# Patient Record
Sex: Female | Born: 1975
Health system: Southern US, Community
[De-identification: ages and names within clinical notes are randomized; demographics above are authoritative.]

## PROBLEM LIST (undated history)

## (undated) DIAGNOSIS — I10 Essential (primary) hypertension: Secondary | ICD-10-CM

## (undated) DIAGNOSIS — R0789 Other chest pain: Secondary | ICD-10-CM

## (undated) DIAGNOSIS — H269 Unspecified cataract: Secondary | ICD-10-CM

## (undated) DIAGNOSIS — R7989 Other specified abnormal findings of blood chemistry: Secondary | ICD-10-CM

## (undated) DIAGNOSIS — Z87440 Personal history of urinary (tract) infections: Secondary | ICD-10-CM

## (undated) DIAGNOSIS — R42 Dizziness and giddiness: Secondary | ICD-10-CM

## (undated) DIAGNOSIS — R072 Precordial pain: Secondary | ICD-10-CM

## (undated) DIAGNOSIS — D509 Iron deficiency anemia, unspecified: Secondary | ICD-10-CM

## (undated) DIAGNOSIS — R002 Palpitations: Secondary | ICD-10-CM

## (undated) HISTORY — PX: EYE SURGERY: SHX253

## (undated) HISTORY — PX: COLONOSCOPY: SHX174

## (undated) HISTORY — DX: Palpitations: R00.2

## (undated) HISTORY — DX: Precordial pain: R07.2

## (undated) HISTORY — DX: Unspecified cataract: H26.9

## (undated) HISTORY — DX: Iron deficiency anemia, unspecified: D50.9

## (undated) HISTORY — DX: Other specified abnormal findings of blood chemistry: R79.89

## (undated) HISTORY — DX: Dizziness and giddiness: R42

## (undated) HISTORY — DX: Personal history of urinary (tract) infections: Z87.440

## (undated) HISTORY — PX: COSMETIC SURGERY: SHX468

## (undated) HISTORY — DX: Other chest pain: R07.89

## (undated) HISTORY — PX: CATARACT EXTRACTION: SUR2

## (undated) HISTORY — PX: UPPER GASTROINTESTINAL ENDOSCOPY: SHX188

---

## 1997-05-16 ENCOUNTER — Ambulatory Visit (HOSPITAL_COMMUNITY): Admission: RE | Admit: 1997-05-16 | Discharge: 1997-05-16 | Payer: Self-pay | Admitting: Obstetrics and Gynecology

## 1999-07-17 ENCOUNTER — Other Ambulatory Visit: Admission: RE | Admit: 1999-07-17 | Discharge: 1999-07-17 | Payer: Self-pay | Admitting: Gynecology

## 2000-10-28 ENCOUNTER — Other Ambulatory Visit: Admission: RE | Admit: 2000-10-28 | Discharge: 2000-10-28 | Payer: Self-pay | Admitting: Gynecology

## 2001-04-14 ENCOUNTER — Emergency Department (HOSPITAL_COMMUNITY): Admission: EM | Admit: 2001-04-14 | Discharge: 2001-04-14 | Payer: Self-pay | Admitting: Emergency Medicine

## 2001-04-14 ENCOUNTER — Encounter: Payer: Self-pay | Admitting: Emergency Medicine

## 2001-11-10 ENCOUNTER — Other Ambulatory Visit: Admission: RE | Admit: 2001-11-10 | Discharge: 2001-11-10 | Payer: Self-pay | Admitting: Gynecology

## 2003-03-11 ENCOUNTER — Other Ambulatory Visit: Admission: RE | Admit: 2003-03-11 | Discharge: 2003-03-11 | Payer: Self-pay | Admitting: Gynecology

## 2003-06-24 ENCOUNTER — Other Ambulatory Visit: Admission: RE | Admit: 2003-06-24 | Discharge: 2003-06-24 | Payer: Self-pay | Admitting: Gynecology

## 2003-12-07 ENCOUNTER — Inpatient Hospital Stay (HOSPITAL_COMMUNITY): Admission: AD | Admit: 2003-12-07 | Discharge: 2003-12-07 | Payer: Self-pay | Admitting: Gynecology

## 2003-12-12 ENCOUNTER — Inpatient Hospital Stay (HOSPITAL_COMMUNITY): Admission: AD | Admit: 2003-12-12 | Discharge: 2003-12-12 | Payer: Self-pay | Admitting: Gynecology

## 2003-12-13 ENCOUNTER — Inpatient Hospital Stay (HOSPITAL_COMMUNITY): Admission: AD | Admit: 2003-12-13 | Discharge: 2003-12-13 | Payer: Self-pay | Admitting: Gynecology

## 2003-12-15 ENCOUNTER — Inpatient Hospital Stay (HOSPITAL_COMMUNITY): Admission: AD | Admit: 2003-12-15 | Discharge: 2003-12-18 | Payer: Self-pay | Admitting: Gynecology

## 2004-01-23 ENCOUNTER — Other Ambulatory Visit: Admission: RE | Admit: 2004-01-23 | Discharge: 2004-01-23 | Payer: Self-pay | Admitting: Gynecology

## 2006-05-11 ENCOUNTER — Other Ambulatory Visit: Admission: RE | Admit: 2006-05-11 | Discharge: 2006-05-11 | Payer: Self-pay | Admitting: Obstetrics and Gynecology

## 2007-10-16 ENCOUNTER — Emergency Department (HOSPITAL_COMMUNITY): Admission: EM | Admit: 2007-10-16 | Discharge: 2007-10-16 | Payer: Self-pay | Admitting: Emergency Medicine

## 2008-05-28 ENCOUNTER — Emergency Department (HOSPITAL_COMMUNITY): Admission: EM | Admit: 2008-05-28 | Discharge: 2008-05-28 | Payer: Self-pay | Admitting: Emergency Medicine

## 2008-09-10 ENCOUNTER — Encounter: Payer: Self-pay | Admitting: Internal Medicine

## 2008-09-10 ENCOUNTER — Emergency Department (HOSPITAL_COMMUNITY): Admission: EM | Admit: 2008-09-10 | Discharge: 2008-09-10 | Payer: Self-pay | Admitting: Family Medicine

## 2008-09-10 LAB — CONVERTED CEMR LAB
BUN: 10 mg/dL
CO2: 27 meq/L
Chloride: 105 meq/L
Creatinine, Ser: 1 mg/dL
Glucose, Bld: 92 mg/dL
HCT: 41 %
Hemoglobin: 13.9 g/dL
Potassium: 3.9 meq/L
Sodium: 139 meq/L

## 2009-06-26 ENCOUNTER — Encounter: Payer: Self-pay | Admitting: Internal Medicine

## 2009-06-26 DIAGNOSIS — F329 Major depressive disorder, single episode, unspecified: Secondary | ICD-10-CM

## 2009-06-26 DIAGNOSIS — R51 Headache: Secondary | ICD-10-CM

## 2009-06-26 DIAGNOSIS — R519 Headache, unspecified: Secondary | ICD-10-CM | POA: Insufficient documentation

## 2010-05-24 LAB — POCT I-STAT, CHEM 8
BUN: 10 mg/dL (ref 6–23)
Calcium, Ion: 1.19 mmol/L (ref 1.12–1.32)
Chloride: 105 mEq/L (ref 96–112)
Creatinine, Ser: 1 mg/dL (ref 0.4–1.2)
Glucose, Bld: 92 mg/dL (ref 70–99)
HCT: 41 % (ref 36.0–46.0)
Hemoglobin: 13.9 g/dL (ref 12.0–15.0)
Potassium: 3.9 mEq/L (ref 3.5–5.1)
Sodium: 139 mEq/L (ref 135–145)
TCO2: 27 mmol/L (ref 0–100)

## 2010-05-24 LAB — POCT PREGNANCY, URINE: Preg Test, Ur: NEGATIVE

## 2010-07-03 NOTE — H&P (Signed)
[name], [name]                 ACCOUNT NO.:  [id number]   MEDICAL RECORD NO.:  [id number]          PATIENT TYPE:  MAT   LOCATION:  [facility]                          FACILITY:  [facility]   PHYSICIAN:  Timothy P. Fontaine, M.D.DATE OF BIRTH:  Jul 28, 1975   DATE OF ADMISSION:  12/12/2003  DATE OF DISCHARGE:                                HISTORY & PHYSICAL   DATE OF ADMISSION:  December 15, 2003   HISTORY OF PRESENT ILLNESS:  A 35 year old G65 P1 female at [redacted] weeks  gestation, increased musculoskeletal pain, low-back pain, discomfort;  ultrasound with 97 percentile infant measuring 3800 g; for induction with  favorable cervix.  The patient is noted to develop increasing  musculoskeletal discomfort that now is significantly limiting her ambulation  with increased pelvic pain.  Recent ultrasound showing large infant  measuring at 3800 g.  Blood pressure showing mild systolic elevations in the  140 range over 50s to 60s diastolic.  Options for continued expectant  management versus admission and induction were reviewed and the patient  elects for induction at 38 weeks.  For the remainder of her history, see her  [facility].   PHYSICAL EXAMINATION:  HEENT:  Normal.  LUNGS:  Clear.  CARDIAC:  Regular rate.  No rubs, murmurs, or gallops.  ABDOMEN:  Gravid, vertex fetus.  NST shows a reactive fetus with some mild,  irregular contractions.  PELVIC:  Loose fingertip, 80%, 0 station.   ASSESSMENT AND PLAN:  A 35 year old gravida 3 para 1 female at 30 weeks,  increased pelvic discomfort limiting ambulation, for induction with  favorable cervix.  Infant noted to be in the 97th percentile with 3800  grams.  The risks, benefits, indications, and alternatives for induction  were reviewed with the patient to include continued expectant management.  The patient is beta strep positive and will plan on antibiotic prophylaxis  during labor.     Timo   TPF/MEDQ  D:  12/12/2003  T:  12/12/2003  Job:   [id number]

## 2011-04-29 ENCOUNTER — Ambulatory Visit: Payer: Self-pay | Admitting: Family

## 2011-05-13 ENCOUNTER — Ambulatory Visit: Payer: Self-pay | Admitting: Family

## 2011-05-13 DIAGNOSIS — Z0289 Encounter for other administrative examinations: Secondary | ICD-10-CM

## 2011-12-27 ENCOUNTER — Encounter (HOSPITAL_COMMUNITY): Payer: Self-pay | Admitting: *Deleted

## 2011-12-27 ENCOUNTER — Emergency Department (HOSPITAL_COMMUNITY)
Admission: EM | Admit: 2011-12-27 | Discharge: 2011-12-28 | Disposition: A | Discharge status: Home / Self Care | Payer: 59 | Attending: Emergency Medicine | Admitting: Emergency Medicine

## 2011-12-27 DIAGNOSIS — N72 Inflammatory disease of cervix uteri: Secondary | ICD-10-CM | POA: Insufficient documentation

## 2011-12-27 DIAGNOSIS — R1032 Left lower quadrant pain: Secondary | ICD-10-CM | POA: Insufficient documentation

## 2011-12-27 DIAGNOSIS — N888 Other specified noninflammatory disorders of cervix uteri: Secondary | ICD-10-CM

## 2011-12-27 DIAGNOSIS — R109 Unspecified abdominal pain: Secondary | ICD-10-CM | POA: Insufficient documentation

## 2011-12-27 LAB — POCT PREGNANCY, URINE: Preg Test, Ur: NEGATIVE

## 2011-12-27 NOTE — ED Notes (Signed)
Pt states left flank pain for a few days. Pt states that the pain is constant and she took advil for the pain. Pt states that she has hx of urinary frequency but today has been having pressure yesterday.

## 2011-12-28 ENCOUNTER — Emergency Department (HOSPITAL_COMMUNITY): Payer: 59

## 2011-12-28 ENCOUNTER — Other Ambulatory Visit: Payer: Self-pay

## 2011-12-28 LAB — URINE MICROSCOPIC-ADD ON

## 2011-12-28 LAB — URINALYSIS, ROUTINE W REFLEX MICROSCOPIC
Bilirubin Urine: NEGATIVE
Glucose, UA: NEGATIVE mg/dL
Ketones, ur: NEGATIVE mg/dL
Leukocytes, UA: NEGATIVE
Nitrite: NEGATIVE
Protein, ur: NEGATIVE mg/dL
Specific Gravity, Urine: 1.03 (ref 1.005–1.030)
Urobilinogen, UA: 1 mg/dL (ref 0.0–1.0)
pH: 5.5 (ref 5.0–8.0)

## 2011-12-28 NOTE — ED Provider Notes (Signed)
History     CSN: [id number]  Arrival date & time 12/27/11  2335   First MD Initiated Contact with Patient 12/28/11 0009      Chief Complaint  Patient presents with  . Flank Pain    (Consider location/radiation/quality/duration/timing/severity/associated sxs/prior treatment) HPI HX per PT. L side and pelvic pain started a few days ago, relived by advil, has noticed some blood in her urine, but unable to say if she has any associated vaginal bleeding. LMP 2 weeks ago on time and normal. No F/C, no N/V/D, followed by OB GYN DR Maurice March. Pain mild to moderate, she took advil PTA and declines any meds now for minimal discomfort. No known aggrevating or alleviating factors. Last few weeks having some palpitations, notices it more when laying down, no SOB and no symptoms currently.  History reviewed. No pertinent past medical history.  History reviewed. No pertinent past surgical history.  History reviewed. No pertinent family history.  History  Substance Use Topics  . Smoking status: Never Smoker   . Smokeless tobacco: Not on file  . Alcohol Use: No    OB History    Grav Para Term Preterm Abortions TAB SAB Ect Mult Living                  Review of Systems  Constitutional: Negative for fever and chills.  HENT: Negative for neck pain and neck stiffness.   Eyes: Negative for pain.  Respiratory: Negative for shortness of breath.   Cardiovascular: Negative for leg swelling.  Gastrointestinal: Negative for nausea, vomiting and abdominal pain.  Genitourinary: Negative for dysuria, flank pain, vaginal discharge, genital sores and vaginal pain.  Musculoskeletal: Negative for back pain.  Skin: Negative for rash.  Neurological: Negative for headaches.  All other systems reviewed and are negative.    Allergies  Review of patient's allergies indicates no known allergies.  Home Medications   Current Outpatient Rx  Name  Route  Sig  Dispense  Refill  . IBUPROFEN 200 MG PO TABS  Oral   Take 400 mg by mouth every 6 (six) hours as needed. For pain           BP 137/92  Pulse 89  Temp 98.1 F (36.7 C) (Oral)  Resp 16  SpO2 99%  Physical Exam  Constitutional: She is oriented to person, place, and time. She appears well-developed and well-nourished.  HENT:  Head: Normocephalic and atraumatic.  Eyes: Conjunctivae normal and EOM are normal. Pupils are equal, round, and reactive to light.  Neck: Trachea normal. Neck supple. No thyromegaly present.  Cardiovascular: Normal rate, regular rhythm, S1 normal, S2 normal and normal pulses.     No systolic murmur is present   No diastolic murmur is present  Pulses:      Radial pulses are 2+ on the right side, and 2+ on the left side.  Pulmonary/Chest: Effort normal and breath sounds normal. She has no wheezes. She has no rhonchi. She has no rales.  Abdominal: Soft. Normal appearance and bowel sounds are normal. There is no CVA tenderness and negative Murphy's sign.       Mild LLQ tenderness. No acute ABD  Neurological: She is alert and oriented to person, place, and time. She has normal strength. No cranial nerve deficit or sensory deficit. GCS eye subscore is 4. GCS verbal subscore is 5. GCS motor subscore is 6.  Skin: Skin is warm and dry. No rash noted. She is not diaphoretic.  Psychiatric: Her speech is  normal.       Cooperative and appropriate    ED Course  Procedures (including critical care time)  Results for orders placed during the hospital encounter of 12/27/11  URINALYSIS, ROUTINE W REFLEX MICROSCOPIC      Component Value Range   Color, Urine YELLOW  YELLOW   APPearance CLOUDY (*) CLEAR   Specific Gravity, Urine 1.030  1.005 - 1.030   pH 5.5  5.0 - 8.0   Glucose, UA NEGATIVE  NEGATIVE mg/dL   Hgb urine dipstick LARGE (*) NEGATIVE   Bilirubin Urine NEGATIVE  NEGATIVE   Ketones, ur NEGATIVE  NEGATIVE mg/dL   Protein, ur NEGATIVE  NEGATIVE mg/dL   Urobilinogen, UA 1.0  0.0 - 1.0 mg/dL   Nitrite  NEGATIVE  NEGATIVE   Leukocytes, UA NEGATIVE  NEGATIVE  POCT PREGNANCY, URINE      Component Value Range   Preg Test, Ur NEGATIVE  NEGATIVE  URINE MICROSCOPIC-ADD ON      Component Value Range   Squamous Epithelial / LPF FEW (*) RARE   WBC, UA 0-2  <3 WBC/hpf   RBC / HPF 3-6  <3 RBC/hpf   Bacteria, UA MANY (*) RARE   Casts HYALINE CASTS (*) NEGATIVE   Urine-Other MUCOUS PRESENT     Ct Abdomen Pelvis Wo Contrast  12/28/2011  *RADIOLOGY REPORT*  Clinical Data: Left-sided abdominal pain.  Frequent urination.  CT ABDOMEN AND PELVIS WITHOUT CONTRAST  Technique:  Multidetector CT imaging of the abdomen and pelvis was performed following the standard protocol without intravenous contrast.  Comparison: None.  Findings: Lung bases are clear.  No effusions.  Heart is normal size.  Liver, stomach, gallbladder, spleen, pancreas, adrenals and kidneys are unremarkable.  No renal or ureteral stones.  No hydronephrosis.  Uterus and adnexa as well as urinary bladder grossly unremarkable. Bowel grossly unremarkable.  No free fluid, free air, or adenopathy.  Appendix is visualized and is normal.  No acute bony abnormality.  Small umbilical hernia containing fat as well as a single loop of small bowel.  No obstruction.  IMPRESSION: No renal or ureteral stones.  No hydronephrosis.  Umbilical hernia containing fat and a single small bowel loop.  No obstruction.   Original Report Authenticated By: Charlett Nose, M.D.    US Transvaginal Non-ob  12/28/2011  *RADIOLOGY REPORT*  Clinical Data: Pelvic pain  TRANSABDOMINAL AND TRANSVAGINAL ULTRASOUND OF PELVIS  Technique:  Both transabdominal and transvaginal ultrasound examinations of the pelvis were performed.  Transabdominal technique was performed for global imaging of the pelvis including uterus, ovaries, adnexal regions, and pelvic cul-de-sac.  It was necessary to proceed with endovaginal exam following the transabdominal exam to visualize the endometrium, and ovaries.   Comparison:  None.  Findings: Uterus:  The uterus measures 8.9 x 4.9 x 5.5 cm.  The myometrium is heterogeneous.  Partially submucosal structure within the anterior uterine fundus is identified measuring 1.8 x 1.5 x 0.9 cm.  Within the anterior and left side of the cervix there is a uniformly hypoechoic structure measuring 1 cm.  Several Nabothian cysts are noted.  Endometrium: Appears normal measuring 6 mm.  Right ovary: Normal appearance/no adnexal mass.  Measuring 3.1 x 1.5 x 2.0 cm.  Left ovary: Normal appearance/no adnexal mass.  Measures 2.6 x 1.9 x 2.2 cm.  Other Findings:  No free fluid.  Bilateral Doppler color flow to both ovaries noted.  IMPRESSION:  1. Uniformly hypoechoic structure within the ventral left side of the cervix is identified  and is favored to represent a complicated Nabothian cyst. 2. Partially the submucosal structure within the anterior and ventral uterine fundus is noted.  Favored to represent a small submucosal fibroid.   Original Report Authenticated By: Signa Kell, M.D.    US Pelvis Complete  12/28/2011  *RADIOLOGY REPORT*  Clinical Data: Pelvic pain  TRANSABDOMINAL AND TRANSVAGINAL ULTRASOUND OF PELVIS  Technique:  Both transabdominal and transvaginal ultrasound examinations of the pelvis were performed.  Transabdominal technique was performed for global imaging of the pelvis including uterus, ovaries, adnexal regions, and pelvic cul-de-sac.  It was necessary to proceed with endovaginal exam following the transabdominal exam to visualize the endometrium, and ovaries.  Comparison:  None.  Findings: Uterus:  The uterus measures 8.9 x 4.9 x 5.5 cm.  The myometrium is heterogeneous.  Partially submucosal structure within the anterior uterine fundus is identified measuring 1.8 x 1.5 x 0.9 cm.  Within the anterior and left side of the cervix there is a uniformly hypoechoic structure measuring 1 cm.  Several Nabothian cysts are noted.  Endometrium: Appears normal measuring 6 mm.   Right ovary: Normal appearance/no adnexal mass.  Measuring 3.1 x 1.5 x 2.0 cm.  Left ovary: Normal appearance/no adnexal mass.  Measures 2.6 x 1.9 x 2.2 cm.  Other Findings:  No free fluid.  Bilateral Doppler color flow to both ovaries noted.  IMPRESSION:  1. Uniformly hypoechoic structure within the ventral left side of the cervix is identified and is favored to represent a complicated Nabothian cyst. 2. Partially the submucosal structure within the anterior and ventral uterine fundus is noted.  Favored to represent a small submucosal fibroid.   Original Report Authenticated By: Signa Kell, M.D.       Date: 12/28/2011  Rate: 80  Rhythm: normal sinus rhythm  QRS Axis: normal  Intervals: normal  ST/T Wave abnormalities: nonspecific ST changes  Conduction Disutrbances:none  Narrative Interpretation:   Old EKG Reviewed: none available  PT reports some vaginal bleeding with [place], declines pain meds.  MDM   LLQ ABD pain evaluated with [place] - small fibroid and also nabothian cyst UA obtained/ reviewed  - U Cx sent and pending Screening ECG no arrythmia. Plan outpatient follow up for palpitations CT to evaluate for possible kidney stone, umbilical hernia fat containing only, repeat exam NTTP.  [place] reviewed and with some bleeding during [place], will plan f/u OB GYN. PT declines Rx pain meds, is stable for d/c home.       Sunnie Nielsen, MD 12/28/11 [phone]

## 2012-08-06 ENCOUNTER — Encounter (HOSPITAL_COMMUNITY): Payer: Self-pay | Admitting: Adult Health

## 2012-08-06 ENCOUNTER — Emergency Department (HOSPITAL_COMMUNITY)
Admission: EM | Admit: 2012-08-06 | Discharge: 2012-08-06 | Disposition: A | Discharge status: Home / Self Care | Payer: 59 | Attending: Emergency Medicine | Admitting: Emergency Medicine

## 2012-08-06 ENCOUNTER — Emergency Department (HOSPITAL_COMMUNITY): Payer: 59

## 2012-08-06 DIAGNOSIS — H53149 Visual discomfort, unspecified: Secondary | ICD-10-CM | POA: Insufficient documentation

## 2012-08-06 DIAGNOSIS — M25561 Pain in right knee: Secondary | ICD-10-CM

## 2012-08-06 DIAGNOSIS — M25569 Pain in unspecified knee: Secondary | ICD-10-CM | POA: Insufficient documentation

## 2012-08-06 DIAGNOSIS — G8911 Acute pain due to trauma: Secondary | ICD-10-CM | POA: Insufficient documentation

## 2012-08-06 NOTE — ED Provider Notes (Signed)
History  This chart was scribed for Maria Jakes, MD by Ardelia Mems, ED Scribe. This patient was seen in room TR10C/TR10C and the patient's care was started at 8:56 PM.   CSN: [id number]  Arrival date & time 08/06/12  1947     Chief Complaint  Patient presents with  . Knee Pain     The history is provided by the patient. No language interpreter was used.    HPI Comments: Maria [name] is a 37 y.o. female who presents to the Emergency Department complaining of constant, moderate, "throbbing" right knee pain with intermittent, severe "sharp" bursts of pain with sudden movements of the knee onset 2 weeks ago. Pt states that she wanted to wait at least a week before going to the doctor to allow the knee to heal. Pt states that she fell a couple of weeks ago, while playing kickball, and she twisted her right knee on the way down. Pt states that the knee becomes stiff at night. She deneis hx of knee pain or previous injuries. Pt denies head injury or LOC associated with the fall. Pt denies back pain, neck pain, vomiting, diarrhea or any other symptoms or pain.  PCP- Dr. Lynden Ang   History reviewed. No pertinent past medical history.  History reviewed. No pertinent past surgical history.  History reviewed. No pertinent family history.  History  Substance Use Topics  . Smoking status: Never Smoker   . Smokeless tobacco: Not on file  . Alcohol Use: No    OB History   Grav Para Term Preterm Abortions TAB SAB Ect Mult Living                  Review of Systems  Constitutional: Negative for fever and chills.  HENT: Negative for sore throat and neck pain.   Eyes: Positive for photophobia.  Respiratory: Negative for cough and shortness of breath.   Cardiovascular: Negative for chest pain.  Gastrointestinal: Negative for nausea, vomiting, diarrhea and abdominal distention.  Genitourinary: Negative for dysuria.  Musculoskeletal: Negative for back pain.  Skin:  Negative for rash.   A complete 10 system review of systems was obtained and all systems are negative except as noted in the HPI and PMH.   Allergies  Review of patient's allergies indicates no known allergies.  Home Medications   Current Outpatient Rx  Name  Route  Sig  Dispense  Refill  . ibuprofen (ADVIL,MOTRIN) 200 MG tablet   Oral   Take 400 mg by mouth every 6 (six) hours as needed. For pain           Triage Vitals: BP 139/77  Pulse 78  Temp(Src) 98.6 F (37 C) (Oral)  Resp 16  SpO2 100%  LMP 07/15/2012  Physical Exam  Nursing note and vitals reviewed. Constitutional: She is oriented to person, place, and time. She appears well-developed and well-nourished. No distress.  HENT:  Head: Normocephalic and atraumatic.  Eyes: Conjunctivae and EOM are normal. Pupils are equal, round, and reactive to light.  Neck: Normal range of motion. Neck supple.  No meningeal signs  Cardiovascular: Normal rate, regular rhythm and normal heart sounds.  Exam reveals no gallop and no friction rub.   No murmur heard. Pulmonary/Chest: Effort normal and breath sounds normal. No respiratory distress. She has no wheezes. She has no rales. She exhibits no tenderness.  Abdominal: Soft. Bowel sounds are normal. She exhibits no distension. There is no tenderness. There is no rebound and no guarding.  Musculoskeletal: Normal range of motion. She exhibits no edema and no tenderness.  5/5 strength throughout. Mild swelling. No erythema. No warmth. No effusion. Good quadricep strength on straight leg raise. No joint laxity.  Right knee: Tenderness to palpation of the medial aspect of the patella. Tenderness of the MCL with FROM of the knee. Able to bear weight.   Neurological: She is alert and oriented to person, place, and time. No cranial nerve deficit.  Skin: Skin is warm and dry. She is not diaphoretic. No erythema.  Psychiatric: She has a normal mood and affect.    ED Course  Procedures  (including critical care time)  DIAGNOSTIC STUDIES: Oxygen Saturation is 100% on RA, normal by my interpretation.    COORDINATION OF CARE: 9:10 PM- Pt advised of plan for treatment and pt agrees.     Labs Reviewed - No data to display Dg Knee Complete 4 Views Right  08/06/2012   *RADIOLOGY REPORT*  Clinical Data: Knee pain after a kickball injury.  RIGHT KNEE - COMPLETE 4+ VIEW  Comparison: None.  Findings: There is no fracture, dislocation, arthritis, or joint effusion.  IMPRESSION: Normal exam.   Original Report Authenticated By: Francene Boyers, M.D.     1. Medial knee pain, right       MDM  Imaging shows no fracture. Directed pt to ice injury, take acetaminophen or ibuprofen for pain, and to elevate and rest the injury when possible. Discussed follow up with ortho if pain persisted or worsened. Pt declined leg immobilizer and crutches. Discussed reasons to seek immediate care. Patient expresses understanding and agrees with plan. I personally performed the services described in this documentation, which was scribed in my presence. The recorded information has been reviewed and is accurate.    Glade Nurse, PA-C 08/06/12 2356

## 2012-08-06 NOTE — ED Notes (Signed)
Presents with 2-3 weeks of right knee pain that feels like a tugging sensation and began after a fall. No edema noted. CMS intact. Pain is worse when sitting or being still for long periods. Movement makes pain better.

## 2012-08-08 NOTE — ED Provider Notes (Signed)
Medical screening examination/treatment/procedure(s) were performed by non-physician practitioner and as supervising physician I was immediately available for consultation/collaboration.   Jedidiah Demartini W. Jisela Merlino, MD 08/08/12 0755 

## 2014-04-23 ENCOUNTER — Emergency Department (INDEPENDENT_AMBULATORY_CARE_PROVIDER_SITE_OTHER)
Admission: EM | Admit: 2014-04-23 | Discharge: 2014-04-23 | Disposition: A | Discharge status: Home / Self Care | Payer: 59 | Source: Home / Self Care | Attending: Family Medicine | Admitting: Family Medicine

## 2014-04-23 ENCOUNTER — Encounter (HOSPITAL_COMMUNITY): Payer: Self-pay | Admitting: Emergency Medicine

## 2014-04-23 DIAGNOSIS — J040 Acute laryngitis: Secondary | ICD-10-CM

## 2014-04-23 MED ORDER — PREDNISONE 10 MG PO TABS: 30.0000 mg | ORAL_TABLET | Freq: Every day | ORAL | Status: DC

## 2014-04-23 MED ORDER — IPRATROPIUM BROMIDE 0.06 % NA SOLN: 2.0000 | Freq: Four times a day (QID) | NASAL | Status: DC

## 2014-04-23 NOTE — ED Notes (Signed)
Sore throat and ear pain, 2 weeks

## 2014-04-23 NOTE — Discharge Instructions (Signed)
Thank you for coming in today. Call or go to the emergency room if you get worse, have trouble breathing, have chest pains, or palpitations.    Laryngitis At the top of your windpipe is your voice box. It is the source of your voice. Inside your voice box are 2 bands of muscles called vocal cords. When you breathe, your vocal cords are relaxed and open so that air can get into the lungs. When you decide to say something, these cords come together and vibrate. The sound from these vibrations goes into your throat and comes out through your mouth as sound. Laryngitis is an inflammation of the vocal cords that causes hoarseness, cough, loss of voice, sore throat, and dry throat. Laryngitis can be temporary (acute) or long-term (chronic). Most cases of acute laryngitis improve with time.Chronic laryngitis lasts for more than 3 weeks. CAUSES Laryngitis can often be related to excessive smoking, talking, or yelling, as well as inhalation of toxic fumes and allergies. Acute laryngitis is usually caused by a viral infection, vocal strain, measles or mumps, or bacterial infections. Chronic laryngitis is usually caused by vocal cord strain, vocal cord injury, postnasal drip, growths on the vocal cords, or acid reflux. SYMPTOMS   Cough.  Sore throat.  Dry throat. RISK FACTORS  Respiratory infections.  Exposure to irritating substances, such as cigarette smoke, excessive amounts of alcohol, stomach acids, and workplace chemicals.  Voice trauma, such as vocal cord injury from shouting or speaking too loud. DIAGNOSIS  Your cargiver will perform a physical exam. During the physical exam, your caregiver will examine your throat. The most common sign of laryngitis is hoarseness. Laryngoscopy may be necessary to confirm the diagnosis of this condition. This procedure allows your caregiver to look into the larynx. HOME CARE INSTRUCTIONS  Drink enough fluids to keep your urine clear or pale yellow.  Rest  until you no longer have symptoms or as directed by your caregiver.  Breathe in moist air.  Take all medicine as directed by your caregiver.  Do not smoke.  Talk as little as possible (this includes whispering).  Write on paper instead of talking until your voice is back to normal.  Follow up with your caregiver if your condition has not improved after 10 days. SEEK MEDICAL CARE IF:   You have trouble breathing.  You cough up blood.  You have persistent fever.  You have increasing pain.  You have difficulty swallowing. MAKE SURE YOU:  Understand these instructions.  Will watch your condition.  Will get help right away if you are not doing well or get worse. Document Released: 02/01/2005 Document Revised: 04/26/2011 Document Reviewed: 04/09/2010 [facility] Patient Information 2015 [place], [place]. This information is not intended to replace advice given to you by your health care provider. Make sure you discuss any questions you have with your health care provider.

## 2014-04-23 NOTE — ED Provider Notes (Signed)
Maria [name] is a 39 y.o. female who presents to Urgent Care today for sore throat. Patient notes a three-day history of sore throat. Symptoms have been mildly present for 2 weeks but worsened recently. She also developed hoarse voice today. No fevers chills nausea or vomiting. Patient had diarrhea yesterday. She's tried some over-the-counter medications which helps a bit. No trouble breathing or chest pains.   History reviewed. No pertinent past medical history. History reviewed. No pertinent past surgical history. History  Substance Use Topics  . Smoking status: Never Smoker   . Smokeless tobacco: Not on file  . Alcohol Use: No   ROS as above Medications: No current facility-administered medications for this encounter.   Current Outpatient Prescriptions  Medication Sig Dispense Refill  . ibuprofen (ADVIL,MOTRIN) 200 MG tablet Take 400 mg by mouth every 6 (six) hours as needed. For pain    . ipratropium (ATROVENT) 0.06 % nasal spray Place 2 sprays into both nostrils 4 (four) times daily. 15 mL 1  . predniSONE (DELTASONE) 10 MG tablet Take 3 tablets (30 mg total) by mouth daily. 15 tablet 0   No Known Allergies   Exam:  BP 158/88 mmHg  Pulse 79  Temp(Src) 98.4 F (36.9 C) (Oral)  Resp 18  SpO2 100% Gen: Well NAD HEENT: EOMI,  MMM posterior fracture cobblestoning. Normal tympanic membranes bilaterally Lungs: Normal work of breathing. CTABL hoarse voice Heart: RRR no MRG Abd: NABS, Soft. Nondistended, Nontender Exts: Brisk capillary refill, warm and well perfused.   No results found for this or any previous visit (from the past 24 hour(s)). No results found.  Assessment and Plan: 39 y.o. female with laryngitis treat with prednisone and Atrovent nasal spray.  Discussed warning signs or symptoms. Please see discharge instructions. Patient expresses understanding.     Gregor Hams, MD 04/23/14 2111

## 2014-05-05 ENCOUNTER — Emergency Department (INDEPENDENT_AMBULATORY_CARE_PROVIDER_SITE_OTHER)
Admission: EM | Admit: 2014-05-05 | Discharge: 2014-05-05 | Disposition: A | Discharge status: Home / Self Care | Payer: 59 | Source: Home / Self Care | Attending: Family Medicine | Admitting: Family Medicine

## 2014-05-05 ENCOUNTER — Encounter (HOSPITAL_COMMUNITY): Payer: Self-pay | Admitting: Emergency Medicine

## 2014-05-05 DIAGNOSIS — J040 Acute laryngitis: Secondary | ICD-10-CM | POA: Diagnosis not present

## 2014-05-05 NOTE — Discharge Instructions (Signed)
Laryngitis °At the top of your windpipe is your voice box. It is the source of your voice. Inside your voice box are 2 bands of muscles called vocal cords. When you breathe, your vocal cords are relaxed and open so that air can get into the lungs. When you decide to say something, these cords come together and vibrate. The sound from these vibrations goes into your throat and comes out through your mouth as sound.  °Laryngitis is an inflammation of the vocal cords that causes hoarseness, cough, loss of voice, sore throat, and dry throat. Laryngitis can be temporary (acute) or long-term (chronic). Most cases of acute laryngitis improve with time.Chronic laryngitis lasts for more than 3 weeks. °CAUSES °Laryngitis can often be related to excessive smoking, talking, or yelling, as well as inhalation of toxic fumes and allergies. Acute laryngitis is usually caused by a viral infection, vocal strain, measles or mumps, or bacterial infections. Chronic laryngitis is usually caused by vocal cord strain, vocal cord injury, postnasal drip, growths on the vocal cords, or acid reflux. °SYMPTOMS  °· Cough. °· Sore throat. °· Dry throat. °RISK FACTORS °· Respiratory infections. °· Exposure to irritating substances, such as cigarette smoke, excessive amounts of alcohol, stomach acids, and workplace chemicals. °· Voice trauma, such as vocal cord injury from shouting or speaking too loud. °DIAGNOSIS  °Your cargiver will perform a physical exam. During the physical exam, your caregiver will examine your throat. The most common sign of laryngitis is hoarseness. Laryngoscopy may be necessary to confirm the diagnosis of this condition. This procedure allows your caregiver to look into the larynx. °HOME CARE INSTRUCTIONS °· Drink enough fluids to keep your urine clear or pale yellow. °· Rest until you no longer have symptoms or as directed by your caregiver. °· Breathe in moist air. °· Take all medicine as directed by your  caregiver. °· Do not smoke. °· Talk as little as possible (this includes whispering). °· Write on paper instead of talking until your voice is back to normal. °· Follow up with your caregiver if your condition has not improved after 10 days. °SEEK MEDICAL CARE IF:  °· You have trouble breathing. °· You cough up blood. °· You have persistent fever. °· You have increasing pain. °· You have difficulty swallowing. °MAKE SURE YOU: °· Understand these instructions. °· Will watch your condition. °· Will get help right away if you are not doing well or get worse. °Document Released: 02/01/2005 Document Revised: 04/26/2011 Document Reviewed: 04/09/2010 °ExitCare® Patient Information ©2015 [employer]. This information is not intended to replace advice given to you by your health care provider. Make sure you discuss any questions you have with your health care provider. ° °

## 2014-05-05 NOTE — ED Provider Notes (Signed)
CSN: [id number]     Arrival date & time 05/05/14  5638 History   First MD Initiated Contact with Patient 05/05/14 [phone]     Chief Complaint  Patient presents with  . Sore Throat   (Consider location/radiation/quality/duration/timing/severity/associated sxs/prior Treatment) HPI       39 year old female presents for evaluation of laryngitis and ear discomfort. She started having the laryngitis about 3 weeks ago. She was seen here 2 weeks ago, treated with prednisone and Atrovent nasal spray. She took the prednisone which helped slightly but did not start the Atrovent nasal spray until the day before yesterday. She is still having the laryngitis. The severity waxes and wanes. She denies any pain in her throat or dysphagia. No chest pain or shortness of breath. She has never smoked.  History reviewed. No pertinent past medical history. Past Surgical History  Procedure Laterality Date  . Eye surgery     No family history on file. History  Substance Use Topics  . Smoking status: Never Smoker   . Smokeless tobacco: Not on file  . Alcohol Use: No   OB History    No data available     Review of Systems  Constitutional: Negative for fever and chills.  HENT: Positive for ear pain and voice change. Negative for postnasal drip, rhinorrhea and sore throat.   Respiratory: Positive for cough (mild, dry).   All other systems reviewed and are negative.   Allergies  Review of patient's allergies indicates no known allergies.  Home Medications   Prior to Admission medications   Medication Sig Start Date End Date Taking? Authorizing Provider  ipratropium (ATROVENT) 0.06 % nasal spray Place 2 sprays into both nostrils 4 (four) times daily. 04/23/14  Yes Gregor Hams, MD  ibuprofen (ADVIL,MOTRIN) 200 MG tablet Take 400 mg by mouth every 6 (six) hours as needed. For pain    Historical Provider, MD  predniSONE (DELTASONE) 10 MG tablet Take 3 tablets (30 mg total) by mouth daily. 04/23/14   Gregor Hams,  MD   BP 131/77 mmHg  Pulse 86  Temp(Src) 98 F (36.7 C) (Oral)  Resp 18  SpO2 99%  LMP 04/21/2014 Physical Exam  Constitutional: She is oriented to person, place, and time. Vital signs are normal. She appears well-developed and well-nourished. No distress.  HENT:  Head: Normocephalic and atraumatic.  Right Ear: External ear normal.  Left Ear: External ear normal.  Nose: Nose normal.  Mouth/Throat: Uvula is midline, oropharynx is clear and moist and mucous membranes are normal. No oropharyngeal exudate.  Eyes: Conjunctivae are normal.  Neck: Normal range of motion. Neck supple. No thyromegaly present.  Pulmonary/Chest: Effort normal and breath sounds normal. No respiratory distress. She has no wheezes. She has no rales.  Lymphadenopathy:    She has no cervical adenopathy.  Neurological: She is alert and oriented to person, place, and time. She has normal strength. Coordination normal.  Skin: Skin is warm and dry. No rash noted. She is not diaphoretic.  Psychiatric: She has a normal mood and affect. Judgment normal.  Nursing note and vitals reviewed.   ED Course  Procedures (including critical care time) Labs Review Labs Reviewed - No data to display  Imaging Review No results found.   MDM   1. Laryngitis    Laryngitis ongoing for 3 weeks. The fact that it waxes and wanes is reassuring. Advised her to treat with voice rest for one week, if this is still not lead to significant improvement she'll  follow-up with primary care or ENT for further evaluation.    Liam Graham, PA-C 05/05/14 [phone]

## 2014-05-05 NOTE — ED Notes (Signed)
Persistent sore throat.  [facility] at [facility] same 3/8.  Sore throat continues.  Had 2 days where throat was better.  No new symptoms

## 2015-12-17 ENCOUNTER — Ambulatory Visit (HOSPITAL_COMMUNITY)
Admission: EM | Admit: 2015-12-17 | Discharge: 2015-12-17 | Disposition: A | Discharge status: Home / Self Care | Payer: 59 | Attending: Emergency Medicine | Admitting: Emergency Medicine

## 2015-12-17 ENCOUNTER — Encounter (HOSPITAL_COMMUNITY): Payer: Self-pay | Admitting: Emergency Medicine

## 2015-12-17 DIAGNOSIS — G500 Trigeminal neuralgia: Secondary | ICD-10-CM

## 2015-12-17 HISTORY — DX: Essential (primary) hypertension: I10

## 2015-12-17 MED ORDER — GABAPENTIN 300 MG PO CAPS: 300.0000 mg | ORAL_CAPSULE | Freq: Every day | ORAL | 0 refills | Status: DC

## 2015-12-17 NOTE — Discharge Instructions (Signed)
A nerve in your head and face is irritated. This is what is causing the intermittent pains in your scalp. Take gabapentin daily at bedtime for 2 weeks. If things are not improving in 1-2 weeks, please follow-up with your primary care doctor.

## 2015-12-17 NOTE — ED Provider Notes (Signed)
[place]    CSN: [id number] Arrival date & time: 12/17/15  1004     History   Chief Complaint Chief Complaint  Patient presents with  . Sore Throat    HPI Maria [name] is a 40 y.o. female.   HPI She is a 40 year old woman here for evaluation of sore throat, ear pain, and scalp pain.  She states for the past 3-4 days, she has had a mild left-sided sore throat and left ear discomfort. She also developed a cold sore on her left lower lip. No fevers. She is more concerned about intermittent pains and sensitivity to her left frontal scalp. She states these have been getting more frequent and more intense over the last 3 days. She states her scalp feels sensitive. She denies any triggers for the pain. No other neurologic symptoms.  Past Medical History:  Diagnosis Date  . Hypertension     Patient Active Problem List   Diagnosis Date Noted  . DEPRESSION 06/26/2009  . HEADACHE 06/26/2009    Past Surgical History:  Procedure Laterality Date  . EYE SURGERY      OB History    No data available       Home Medications    Prior to Admission medications   Medication Sig Start Date End Date Taking? Authorizing Provider  gabapentin (NEURONTIN) 300 MG capsule Take 1 capsule (300 mg total) by mouth at bedtime. 12/17/15   Melony Overly, MD  ibuprofen (ADVIL,MOTRIN) 200 MG tablet Take 400 mg by mouth every 6 (six) hours as needed. For pain    Historical Provider, MD  ipratropium (ATROVENT) 0.06 % nasal spray Place 2 sprays into both nostrils 4 (four) times daily. 04/23/14   Gregor Hams, MD  predniSONE (DELTASONE) 10 MG tablet Take 3 tablets (30 mg total) by mouth daily. 04/23/14   Gregor Hams, MD    Family History History reviewed. No pertinent family history.  Social History Social History  Substance Use Topics  . Smoking status: Never Smoker  . Smokeless tobacco: Never Used  . Alcohol use No     Allergies   Review of patient's allergies indicates no  known allergies.   Review of Systems Review of Systems As in history of present illness  Physical Exam Triage Vital Signs ED Triage Vitals  Enc Vitals Group     BP 12/17/15 1020 161/98     Pulse Rate 12/17/15 1020 99     Resp 12/17/15 1020 16     Temp 12/17/15 1020 98.8 F (37.1 C)     Temp Source 12/17/15 1020 Oral     SpO2 12/17/15 1020 100 %     Weight --      Height --      Head Circumference --      Peak Flow --      Pain Score 12/17/15 1024 0     Pain Loc --      Pain Edu? --      Excl. in [place]? --    No data found.   Updated Vital Signs BP 161/98 (BP Location: Left Arm)   Pulse 99   Temp 98.8 F (37.1 C) (Oral)   Resp 16   LMP 12/03/2015 (Exact Date)   SpO2 100%   Visual Acuity Right Eye Distance:   Left Eye Distance:   Bilateral Distance:    Right Eye Near:   Left Eye Near:    Bilateral Near:     Physical  Exam  Constitutional: She is oriented to person, place, and time. She appears well-developed and well-nourished. No distress.  HENT:  Head:    Right Ear: Tympanic membrane normal.  Left Ear: Tympanic membrane is retracted (mildly).  Mouth/Throat: Oropharynx is clear and moist. No oropharyngeal exudate.    Neck: Neck supple.  Cardiovascular: Normal rate, regular rhythm and normal heart sounds.   No murmur heard. Pulmonary/Chest: Effort normal and breath sounds normal. No respiratory distress. She has no wheezes. She has no rales.  Lymphadenopathy:    She has no cervical adenopathy.  Neurological: She is alert and oriented to person, place, and time.     UC Treatments / Results  Labs (all labs ordered are listed, but only abnormal results are displayed) Labs Reviewed - No data to display  EKG  EKG Interpretation None       Radiology No results found.  Procedures Procedures (including critical care time)  Medications Ordered in UC Medications - No data to display   Initial Impression / Assessment and Plan / UC Course  I  have reviewed the triage vital signs and the nursing notes.  Pertinent labs & imaging results that were available during my care of the patient were reviewed by me and considered in my medical decision making (see chart for details).  Clinical Course    No sign of infection on history or exam. Given the distribution and description of pain, scalp sensitivity may be coming from trigeminal neuralgia. We'll do a trial of gabapentin for 2 weeks. If not improving or symptoms are worsening, she will follow-up with her PCP at [facility].  Final Clinical Impressions(s) / UC Diagnoses   Final diagnoses:  Trigeminal neuralgia    New Prescriptions New Prescriptions   GABAPENTIN (NEURONTIN) 300 MG CAPSULE    Take 1 capsule (300 mg total) by mouth at bedtime.     Melony Overly, MD 12/17/15 1052

## 2015-12-17 NOTE — ED Triage Notes (Signed)
The patient presented to the [facility] with a complaint of a sore throat, neck pain on the left side of her neck and head pain on the left side of her head. The patient described the head pain not as a headache but a sharp intermittent pain only on the left side of her head for 3 days.

## 2016-01-16 ENCOUNTER — Encounter: Payer: Self-pay | Admitting: Obstetrics & Gynecology

## 2016-01-16 ENCOUNTER — Other Ambulatory Visit: Payer: Self-pay | Admitting: Obstetrics & Gynecology

## 2016-01-16 ENCOUNTER — Ambulatory Visit (INDEPENDENT_AMBULATORY_CARE_PROVIDER_SITE_OTHER): Payer: 59 | Admitting: Obstetrics & Gynecology

## 2016-01-16 VITALS — BP 146/87 | HR 94 | Temp 97.9°F | Ht 69.0 in | Wt 192.0 lb

## 2016-01-16 DIAGNOSIS — Z01419 Encounter for gynecological examination (general) (routine) without abnormal findings: Secondary | ICD-10-CM

## 2016-01-16 DIAGNOSIS — Z1231 Encounter for screening mammogram for malignant neoplasm of breast: Secondary | ICD-10-CM

## 2016-01-16 DIAGNOSIS — Z0000 Encounter for general adult medical examination without abnormal findings: Secondary | ICD-10-CM | POA: Diagnosis not present

## 2016-01-16 NOTE — Progress Notes (Signed)
Subjective:    Maria [name] is a 40 y.o. MAA P2(22 and 74 yo kids) female who presents for an annual exam. The patient has no complaints today except that her periods are getting heavier as the years go by.  The patient is sexually active. GYN screening history: last pap: was normal. The patient wears seatbelts: yes. The patient participates in regular exercise: no. Has the patient ever been transfused or tattooed?: no. The patient reports that there is not domestic violence in her life.   Menstrual History: OB History    Gravida Para Term Preterm AB Living   2         2   SAB TAB Ectopic Multiple Live Births                  Menarche age: 19 Patient's last menstrual period was 12/21/2015.    The following portions of the patient's history were reviewed and updated as appropriate: allergies, current medications, past family history, past medical history, past social history, past surgical history and problem list.  Review of Systems Pertinent items are noted in HPI.   Married for 12 years, Has to interrupt sex to void, has OAB Has nocturia about 10 times per night, no accidents in day or night FH- No breast/gyn/colon cancer Mammogram done a year ago, due now Wants flu vaccine today FP- none   Objective:    BP (!) 146/87   Pulse 94   Temp 97.9 F (36.6 C) (Oral)   Ht 5\' 9"  (1.753 m)   Wt 192 lb (87.1 kg)   LMP 12/21/2015   BMI 28.35 kg/m   General Appearance:    Alert, cooperative, no distress, appears stated age  Head:    Normocephalic, without obvious abnormality, atraumatic  Eyes:    PERRL, conjunctiva/corneas clear, EOM's intact, fundi    benign, both eyes  Ears:    Normal TM's and external ear canals, both ears  Nose:   Nares normal, septum midline, mucosa normal, no drainage    or sinus tenderness  Throat:   Lips, mucosa, and tongue normal; teeth and gums normal  Neck:   Supple, symmetrical, trachea midline, no adenopathy;    thyroid:  no  enlargement/tenderness/nodules; no carotid   bruit or JVD  Back:     Symmetric, no curvature, ROM normal, no CVA tenderness  Lungs:     Clear to auscultation bilaterally, respirations unlabored  Chest Wall:    No tenderness or deformity   Heart:    Regular rate and rhythm, S1 and S2 normal, no murmur, rub   or gallop  Breast Exam:    No tenderness, masses, or nipple abnormality  Abdomen:     Soft, non-tender, bowel sounds active all four quadrants,    no masses, no organomegaly  Genitalia:    Normal female without lesion, discharge or tenderness     Extremities:   Extremities normal, atraumatic, no cyanosis or edema  Pulses:   2+ and symmetric all extremities  Skin:   Skin color, texture, turgor normal, no rashes or lesions  Lymph nodes:   Cervical, supraclavicular, and axillary nodes normal  Neurologic:   CNII-XII intact, normal strength, sensation and reflexes    throughout   .    Assessment:    Healthy female exam.    Plan:     Thin prep Pap smear. with cotesting Fasting labs at her convenience Mammogram Flu vaccine

## 2016-01-19 ENCOUNTER — Encounter: Payer: Self-pay | Admitting: *Deleted

## 2016-01-19 LAB — CYTOLOGY - PAP
Diagnosis: NEGATIVE
HPV (WINDOPATH): NOT DETECTED

## 2016-01-23 ENCOUNTER — Other Ambulatory Visit: Payer: 59

## 2016-02-13 ENCOUNTER — Ambulatory Visit
Admission: RE | Admit: 2016-02-13 | Discharge: 2016-02-13 | Disposition: A | Discharge status: Home / Self Care | Payer: 59 | Source: Ambulatory Visit | Attending: Obstetrics & Gynecology | Admitting: Obstetrics & Gynecology

## 2016-02-13 DIAGNOSIS — Z1231 Encounter for screening mammogram for malignant neoplasm of breast: Secondary | ICD-10-CM

## 2016-02-17 ENCOUNTER — Encounter: Payer: Self-pay | Admitting: Obstetrics and Gynecology

## 2016-07-22 ENCOUNTER — Encounter (HOSPITAL_COMMUNITY): Payer: Self-pay

## 2016-07-22 DIAGNOSIS — M79601 Pain in right arm: Secondary | ICD-10-CM | POA: Insufficient documentation

## 2016-07-22 DIAGNOSIS — R5381 Other malaise: Secondary | ICD-10-CM | POA: Diagnosis present

## 2016-07-22 DIAGNOSIS — Z5321 Procedure and treatment not carried out due to patient leaving prior to being seen by health care provider: Secondary | ICD-10-CM | POA: Insufficient documentation

## 2016-07-22 LAB — CBC
HEMATOCRIT: 29.1 % — AB (ref 36.0–46.0)
Hemoglobin: 8.9 g/dL — ABNORMAL LOW (ref 12.0–15.0)
MCH: 25.4 pg — ABNORMAL LOW (ref 26.0–34.0)
MCHC: 30.6 g/dL (ref 30.0–36.0)
MCV: 82.9 fL (ref 78.0–100.0)
PLATELETS: 249 10*3/uL (ref 150–400)
RBC: 3.51 MIL/uL — ABNORMAL LOW (ref 3.87–5.11)
RDW: 17.1 % — AB (ref 11.5–15.5)
WBC: 6.4 10*3/uL (ref 4.0–10.5)

## 2016-07-22 LAB — BASIC METABOLIC PANEL
Anion gap: 4 — ABNORMAL LOW (ref 5–15)
BUN: 9 mg/dL (ref 6–20)
CHLORIDE: 105 mmol/L (ref 101–111)
CO2: 27 mmol/L (ref 22–32)
CREATININE: 0.98 mg/dL (ref 0.44–1.00)
Calcium: 9 mg/dL (ref 8.9–10.3)
GFR calc Af Amer: >60 mL/min (ref >60)
GFR calc non Af Amer: >60 mL/min (ref >60)
GLUCOSE: 99 mg/dL (ref 65–99)
POTASSIUM: 3.8 mmol/L (ref 3.5–5.1)
Sodium: 136 mmol/L (ref 135–145)

## 2016-07-22 LAB — I-STAT TROPONIN, ED: Troponin i, poc: 0 ng/mL (ref 0.00–0.08)

## 2016-07-22 NOTE — ED Triage Notes (Signed)
Pt endorses general malaise that began this morning and states that her right arm began hurting later in the day and she had to leave work, Denies injury. Also denies shob, nausea or other cardiac sx. VSS

## 2016-07-23 ENCOUNTER — Emergency Department (HOSPITAL_COMMUNITY)
Admission: EM | Admit: 2016-07-23 | Discharge: 2016-07-23 | Discharge status: Against Medical Advice | Payer: 59 | Attending: Emergency Medicine | Admitting: Emergency Medicine

## 2016-07-23 LAB — I-STAT BETA HCG BLOOD, ED (MC, WL, AP ONLY)

## 2016-07-23 NOTE — ED Notes (Signed)
Pt called multiple times to go back to a room and no response. Left post triage

## 2016-07-23 NOTE — ED Notes (Signed)
Called in waiting room to go back to a room and no response.

## 2016-11-22 ENCOUNTER — Telehealth: Payer: Self-pay

## 2016-11-22 NOTE — Telephone Encounter (Signed)
-----   Message from Blanchie Dessert, [place] sent at 11/22/2016  8:17 AM EDT ----- Regarding: needs refill for [facility] Contact: [phone] Pt states that she needs a refill for [facility], says that she never started taking it. I dont see that a rx was sent to a pharmacy for her but I maybe mistaking. She saw [facility] back in Dec of last year.

## 2016-11-22 NOTE — Telephone Encounter (Signed)
Call patient advised patient that she would need to come in for appointment to discuss getting on birth control.

## 2016-12-06 ENCOUNTER — Encounter: Payer: Self-pay | Admitting: Advanced Practice Midwife

## 2016-12-13 ENCOUNTER — Encounter: Payer: Self-pay | Admitting: Advanced Practice Midwife

## 2016-12-13 ENCOUNTER — Ambulatory Visit (INDEPENDENT_AMBULATORY_CARE_PROVIDER_SITE_OTHER): Payer: 59 | Admitting: Advanced Practice Midwife

## 2016-12-13 VITALS — BP 132/96 | HR 106 | Ht 68.0 in | Wt 194.0 lb

## 2016-12-13 DIAGNOSIS — Z30013 Encounter for initial prescription of injectable contraceptive: Secondary | ICD-10-CM | POA: Diagnosis not present

## 2016-12-13 DIAGNOSIS — Z113 Encounter for screening for infections with a predominantly sexual mode of transmission: Secondary | ICD-10-CM

## 2016-12-13 DIAGNOSIS — Z124 Encounter for screening for malignant neoplasm of cervix: Secondary | ICD-10-CM | POA: Diagnosis not present

## 2016-12-13 DIAGNOSIS — Z01419 Encounter for gynecological examination (general) (routine) without abnormal findings: Secondary | ICD-10-CM | POA: Diagnosis not present

## 2016-12-13 DIAGNOSIS — Z30011 Encounter for initial prescription of contraceptive pills: Secondary | ICD-10-CM

## 2016-12-13 DIAGNOSIS — Z0000 Encounter for general adult medical examination without abnormal findings: Secondary | ICD-10-CM

## 2016-12-13 MED ORDER — MEDROXYPROGESTERONE ACETATE 150 MG/ML IM SUSP: 150.0000 mg | INTRAMUSCULAR | 3 refills | Status: DC

## 2016-12-13 NOTE — Progress Notes (Signed)
Patient ID: Maria [name], female   DOB: 03/13/1975, 41 y.o.   MRN: [id number]    Gynecology Annual Exam  PCP: Patient, No Pcp Per  Chief Complaint:  Chief Complaint  Patient presents with  . Gynecologic Exam    discuss birthcontrol (Pill)  . Menorrhagia    History of Present Illness: Patient is a 41 y.o. [id number] presents for annual exam. The patient has no complaints today. She is requesting Depo Provera for birth control and period control.  LMP: Patient's last menstrual period was 12/04/2016 (exact date). Average Interval: regular, 28 days Duration of flow: 6 days Heavy Menses: varies Clots: no Intermenstrual Bleeding: no Postcoital Bleeding: no Dysmenorrhea: no   The patient is sexually active. She currently uses none for contraception. She denies dyspareunia.  The patient does not perform self breast exams.  There is no notable family history of breast or ovarian cancer in her family.  The patient wears seatbelts: yes.   The patient has regular exercise: yes.    The patient denies current symptoms of depression.    Review of Systems: Review of Systems  Constitutional: Negative.   HENT: Negative.   Eyes: Negative.   Respiratory: Negative.   Cardiovascular: Negative.   Gastrointestinal: Negative.   Genitourinary: Negative.   Musculoskeletal: Negative.   Skin: Negative.   Neurological: Negative.   Endo/Heme/Allergies: Negative.   Psychiatric/Behavioral: Negative.     Past Medical History:  Past Medical History:  Diagnosis Date  . Hypertension     Past Surgical History:  Past Surgical History:  Procedure Laterality Date  . EYE SURGERY      Gynecologic History:  Patient's last menstrual period was 12/04/2016 (exact date). Contraception: none Last Pap: 10 months ago Results were:  no abnormalities  Last mammogram: 2 years ago Results were: BI-RAD I Obstetric History: [id number]  Family History:  Family History  Problem Relation Age of Onset    . Diabetes Maternal Grandmother   . Throat cancer Maternal Grandfather     Social History:  Social History   Social History  . Marital status: Married    Spouse name: N/A  . Number of children: N/A  . Years of education: N/A   Occupational History  . Not on file.   Social History Main Topics  . Smoking status: Never Smoker  . Smokeless tobacco: Never Used  . Alcohol use No  . Drug use: No  . Sexual activity: Yes    Birth control/ protection: None   Other Topics Concern  . Not on file   Social History Narrative  . No narrative on file    Allergies:  No Known Allergies  Medications: Prior to Admission medications   Not on File    Physical Exam Vitals: Blood pressure (!) 132/96, pulse (!) 106, height 5\' 8"  (1.727 m), weight 194 lb (88 kg), last menstrual period 12/04/2016.  General: NAD HEENT: normocephalic, anicteric Thyroid: no enlargement, no palpable nodules Pulmonary: No increased work of breathing, CTAB Cardiovascular: RRR, distal pulses 2+ Breast: Breast symmetrical, no tenderness, no palpable nodules or masses, no skin or nipple retraction present, no nipple discharge.  No axillary or supraclavicular lymphadenopathy. Abdomen: NABS, soft, non-tender, non-distended.  Umbilicus without lesions.  No hepatomegaly, splenomegaly or masses palpable. No evidence of hernia  Genitourinary:  External: Normal external female genitalia.  Normal urethral meatus, normal  Bartholin's and Skene's glands.    Vagina: Normal vaginal mucosa, no evidence of prolapse.    Cervix: Grossly normal in appearance,  no bleeding, no CMT  Uterus: Non-enlarged, mobile, normal contour.    Adnexa: ovaries non-enlarged, no adnexal masses  Rectal: deferred  Lymphatic: no evidence of inguinal lymphadenopathy Extremities: no edema, erythema, or tenderness Neurologic: Grossly intact Psychiatric: mood appropriate, affect full   Assessment: 41 y.o. G2P2002 routine annual  exam  Plan: Problem List Items Addressed This Visit    None    Visit Diagnoses    Well woman exam with routine gynecological exam    -  Primary   Relevant Orders   IGP,CtNgTv,Apt HPV,rfx16/18,45   MM DIGITAL SCREENING BILATERAL   Screen for sexually transmitted diseases       Relevant Orders   IGP,CtNgTv,Apt HPV,rfx16/18,45   Cervical cancer screening       Relevant Orders   IGP,CtNgTv,Apt HPV,rfx16/18,45   Blood tests for routine general physical examination       Relevant Orders   VITAMIN D 25 Hydroxy (Vit-D Deficiency, Fractures)   Hgb A1c w/o eAG   Lipid Panel With LDL/HDL Ratio   CBC   Encounter for initial prescription of injectable contraceptive       Relevant Medications   medroxyPROGESTERone (DEPO-PROVERA) 150 MG/ML injection      1) Mammogram - recommend yearly screening mammogram.  Mammogram Was ordered today   2) STI screening was offered and accepted  3) ASCCP guidelines and rational discussed.  Patient opts for yearly screening interval  4) Contraception - Depo Provera  5) Colonoscopy -- Screening recommended starting at age 43 for average risk individuals, age 71 for individuals deemed at increased risk (including African Americans) and recommended to continue until age 2.  For patient age 26-85 individualized approach is recommended.  Gold standard screening is via colonoscopy, Cologuard screening is an acceptable alternative for patient unwilling or unable to undergo colonoscopy.  "Colorectal cancer screening for average?risk adults: 2018 guideline update from the [place]: A Cancer Journal for Clinicians: Jul 14, 2016  6) Routine healthcare maintenance including cholesterol, diabetes screening discussed Ordered today   7) Return to clinic in 1 year for annual exam  Rod Can, CNM

## 2016-12-14 LAB — VITAMIN D 25 HYDROXY (VIT D DEFICIENCY, FRACTURES): VIT D 25 HYDROXY: 13.1 ng/mL — AB (ref 30.0–100.0)

## 2016-12-14 LAB — LIPID PANEL WITH LDL/HDL RATIO
CHOLESTEROL TOTAL: 180 mg/dL (ref 100–199)
HDL: 56 mg/dL (ref >39)
LDL Calculated: 108 mg/dL — ABNORMAL HIGH (ref 0–99)
LDL/HDL RATIO: 1.9 ratio (ref 0.0–3.2)
TRIGLYCERIDES: 78 mg/dL (ref 0–149)
VLDL Cholesterol Cal: 16 mg/dL (ref 5–40)

## 2016-12-14 LAB — CBC
HEMATOCRIT: 31.9 % — AB (ref 34.0–46.6)
HEMOGLOBIN: 9.3 g/dL — AB (ref 11.1–15.9)
MCH: 24.4 pg — AB (ref 26.6–33.0)
MCHC: 29.2 g/dL — AB (ref 31.5–35.7)
MCV: 84 fL (ref 79–97)
Platelets: 284 10*3/uL (ref 150–379)
RBC: 3.81 x10E6/uL (ref 3.77–5.28)
RDW: 18 % — ABNORMAL HIGH (ref 12.3–15.4)
WBC: 5.4 10*3/uL (ref 3.4–10.8)

## 2016-12-14 LAB — HGB A1C W/O EAG: Hgb A1c MFr Bld: 5.3 % (ref 4.8–5.6)

## 2016-12-16 LAB — IGP,CTNGTV,APT HPV,RFX16/18,45
CHLAMYDIA, NUC. ACID AMP: NEGATIVE
Gonococcus, Nuc. Acid Amp: NEGATIVE
HPV APTIMA: NEGATIVE
PAP SMEAR COMMENT: 0
Trich vag by NAA: NEGATIVE

## 2017-01-10 ENCOUNTER — Telehealth: Payer: Self-pay

## 2017-01-10 MED ORDER — NORGESTIMATE-ETH ESTRADIOL 0.25-35 MG-MCG PO TABS: 1.0000 | ORAL_TABLET | Freq: Every day | ORAL | 11 refills | Status: DC

## 2017-01-10 NOTE — Progress Notes (Signed)
Pt is now requesting Rx for OCP instead of Depo. New Rx sent.

## 2017-01-10 NOTE — Telephone Encounter (Signed)
Pt states she was given a rx for Depo, but has decided against it & would like to request rx for OCP. ZJ#[phone]

## 2017-01-10 NOTE — Telephone Encounter (Signed)
Pt aware.

## 2017-01-10 NOTE — Addendum Note (Signed)
Addended by: Rod Can on: 01/10/2017 02:22 PM   Modules accepted: Orders

## 2017-01-10 NOTE — Telephone Encounter (Signed)
Sent in Rx for OCP instead of Depo. Can you please let her know. Thank you.

## 2017-02-15 HISTORY — PX: ABDOMINOPLASTY: SUR9

## 2017-07-15 ENCOUNTER — Ambulatory Visit: Payer: 59 | Admitting: Obstetrics & Gynecology

## 2017-07-20 ENCOUNTER — Ambulatory Visit: Payer: 59 | Admitting: Advanced Practice Midwife

## 2017-10-26 ENCOUNTER — Emergency Department
Admission: EM | Admit: 2017-10-26 | Discharge: 2017-10-26 | Disposition: A | Discharge status: Home / Self Care | Payer: Self-pay | Attending: Emergency Medicine | Admitting: Emergency Medicine

## 2017-10-26 ENCOUNTER — Other Ambulatory Visit: Payer: Self-pay

## 2017-10-26 ENCOUNTER — Encounter: Payer: Self-pay | Admitting: *Deleted

## 2017-10-26 DIAGNOSIS — J040 Acute laryngitis: Secondary | ICD-10-CM | POA: Diagnosis not present

## 2017-10-26 DIAGNOSIS — R490 Dysphonia: Secondary | ICD-10-CM | POA: Insufficient documentation

## 2017-10-26 DIAGNOSIS — H9202 Otalgia, left ear: Secondary | ICD-10-CM | POA: Insufficient documentation

## 2017-10-26 DIAGNOSIS — Z5321 Procedure and treatment not carried out due to patient leaving prior to being seen by health care provider: Secondary | ICD-10-CM | POA: Insufficient documentation

## 2017-10-26 DIAGNOSIS — R05 Cough: Secondary | ICD-10-CM | POA: Insufficient documentation

## 2017-10-26 NOTE — ED Triage Notes (Signed)
Pt reports left earache and cough.   Pt also reports hoarseness started today.  Pt alert

## 2017-11-10 ENCOUNTER — Encounter: Payer: Self-pay | Admitting: *Deleted

## 2017-11-10 ENCOUNTER — Emergency Department
Admission: EM | Admit: 2017-11-10 | Discharge: 2017-11-10 | Disposition: A | Discharge status: Home / Self Care | Payer: 59 | Attending: Emergency Medicine | Admitting: Emergency Medicine

## 2017-11-10 ENCOUNTER — Other Ambulatory Visit: Payer: Self-pay

## 2017-11-10 DIAGNOSIS — H9202 Otalgia, left ear: Secondary | ICD-10-CM | POA: Insufficient documentation

## 2017-11-10 DIAGNOSIS — I10 Essential (primary) hypertension: Secondary | ICD-10-CM | POA: Insufficient documentation

## 2017-11-10 DIAGNOSIS — J029 Acute pharyngitis, unspecified: Secondary | ICD-10-CM | POA: Diagnosis present

## 2017-11-10 DIAGNOSIS — J040 Acute laryngitis: Secondary | ICD-10-CM | POA: Diagnosis not present

## 2017-11-10 DIAGNOSIS — J028 Acute pharyngitis due to other specified organisms: Secondary | ICD-10-CM | POA: Diagnosis not present

## 2017-11-10 DIAGNOSIS — Z79899 Other long term (current) drug therapy: Secondary | ICD-10-CM | POA: Diagnosis not present

## 2017-11-10 DIAGNOSIS — B9789 Other viral agents as the cause of diseases classified elsewhere: Secondary | ICD-10-CM | POA: Diagnosis not present

## 2017-11-10 MED ORDER — FEXOFENADINE-PSEUDOEPHED ER 60-120 MG PO TB12: 1.0000 | ORAL_TABLET | Freq: Two times a day (BID) | ORAL | 0 refills | Status: AC

## 2017-11-10 MED ORDER — BENZONATATE 100 MG PO CAPS: ORAL_CAPSULE | ORAL | 0 refills | Status: DC

## 2017-11-10 MED ORDER — FLUTICASONE PROPIONATE 50 MCG/ACT NA SUSP: 2.0000 | Freq: Every day | NASAL | 0 refills | Status: DC

## 2017-11-10 MED ORDER — BENZONATATE 100 MG PO CAPS
200.0000 mg | ORAL_CAPSULE | Freq: Once | ORAL | Status: AC
  Administered 2017-11-10: 200 mg via ORAL
  Filled 2017-11-10: qty 2

## 2017-11-10 NOTE — ED Triage Notes (Signed)
Pt has a sore throat and left earache for 3 weeks. Pt alert

## 2017-11-10 NOTE — Discharge Instructions (Signed)
Your exam is consistent with a likely viral URI causing laryngitis and earache. Take the prescription meds as directed. Start an OTC Benadryl at bedtime. You may also take OTC Delsym for cough relief. Drink warm tea to reduce sore throat. Consider voice rest, as appropriate.

## 2017-11-11 NOTE — ED Provider Notes (Signed)
[facility] Emergency Department Provider Note ____________________________________________  Time seen: 2240  I have reviewed the triage vital signs and the nursing notes.  HISTORY  Chief Complaint  Sore Throat  HPI Maria [name] is a 42 y.o. female who presents herself to the ED for evaluation of intermittent left earache and mild sore throat for the last 3 weeks.  Patient also reports hoarseness that is been persistent for at least the last week.  She describes being treated for an upper respiratory infection about 2 weeks prior with a steroid taper pack that she completed about a week earlier.  She reports that her previous cough and sinus congestion responded well to the steroids but her hoarseness and intermittent sharp ear pain has persisted.  She denies any fevers, chills, sweats patient also denies any significant sinus congestion.  She does note some postnasal drainage as well as some persistent clearing of the throat.  She has not taken any other medications in the interim for symptom relief.  She presents now for further evaluation and management.  Nuys any sick contacts, recent travel, or other exposures.  Past Medical History:  Diagnosis Date  . Hypertension     Patient Active Problem List   Diagnosis Date Noted  . DEPRESSION 06/26/2009  . HEADACHE 06/26/2009    Past Surgical History:  Procedure Laterality Date  . EYE SURGERY      Prior to Admission medications   Medication Sig Start Date End Date Taking? Authorizing Provider  benzonatate (TESSALON PERLES) 100 MG capsule Take 1-2 tabs TID prn cough 11/10/17   Blakelynn Scheeler, Dannielle Karvonen, PA-C  fexofenadine-pseudoephedrine (ALLEGRA-D ALLERGY & CONGESTION) 60-120 MG 12 hr tablet Take 1 tablet by mouth 2 (two) times daily for 15 days. 11/10/17 11/25/17  Ronda Kazmi, Dannielle Karvonen, PA-C  fluticasone (FLONASE) 50 MCG/ACT nasal spray Place 2 sprays into both nostrils daily. 11/10/17   Ava Tangney, Dannielle Karvonen, PA-C  norgestimate-ethinyl estradiol (ORTHO-CYCLEN,SPRINTEC,PREVIFEM) 0.25-35 MG-MCG tablet Take 1 tablet by mouth daily. 01/10/17   Rod Can, CNM    Allergies Patient has no known allergies.  Family History  Problem Relation Age of Onset  . Diabetes Maternal Grandmother   . Throat cancer Maternal Grandfather     Social History Social History   Tobacco Use  . Smoking status: Never Smoker  . Smokeless tobacco: Never Used  Substance Use Topics  . Alcohol use: No  . Drug use: No    Review of Systems  Constitutional: Negative for fever. Eyes: Negative for visual changes. ENT: Positive for sore throat and hoarseness.  Reports left otalgia as above. Cardiovascular: Negative for chest pain. Respiratory: Negative for shortness of breath. Gastrointestinal: Negative for abdominal pain, vomiting and diarrhea. Skin: Negative for rash. Neurological: Negative for headaches, focal weakness or numbness. ____________________________________________  PHYSICAL EXAM:  VITAL SIGNS: ED Triage Vitals  Enc Vitals Group     BP 11/10/17 2140 (!) 153/84     Pulse Rate 11/10/17 2140 (!) 103     Resp 11/10/17 2140 18     Temp 11/10/17 2140 99.5 F (37.5 C)     Temp Source 11/10/17 2140 Oral     SpO2 11/10/17 2140 99 %     Weight 11/10/17 2141 190 lb (86.2 kg)     Height 11/10/17 2141 5\' 8"  (1.727 m)     Head Circumference --      Peak Flow --      Pain Score 11/10/17 2141 8  Pain Loc --      Pain Edu? --      Excl. in [place]? --     Constitutional: Alert and oriented. Well appearing and in no distress. Head: Normocephalic and atraumatic. Eyes: Conjunctivae are normal. PERRL. Normal extraocular movements Ears: Canals clear. TMs intact bilaterally.  No serous or purulent effusion noted. Nose: No congestion/rhinorrhea/epistaxis.  Turbinates are pink, moist, and enlarged. Mouth/Throat: Mucous membranes are moist.  There is midline and tonsils are flat.  There is some  oropharyngeal edema appreciated, consistent with likely postnasal drainage. Neck: Supple. No thyromegaly. Hematological/Lymphatic/Immunological: No cervical lymphadenopathy. Cardiovascular: Normal rate, regular rhythm. Normal distal pulses. Respiratory: Normal respiratory effort. No wheezes/rales/rhonchi. ____________________________________________  PROCEDURES  Procedures Tessalon Perles 200 mg PO ____________________________________________  INITIAL IMPRESSION / ASSESSMENT AND PLAN / ED COURSE  Patient with ED evaluation of laryngitis and intermittent ear pain on the left.  Patient's exam is overall benign without any signs of any acute tonsillitis or AOM.  Her symptoms likely represent a viral etiology of continued postnasal drainage.  Her otalgia may be due to a subclinical serous effusion.  Patient is advised to take the prescription Flonase, Tessalon Perles, and Allegra-D as prescribed.  She is also advised she may dose over-the-counter Delsym for additional cough relief.  Voice rest is suggested and the patient is also advised to consider warm liquids or throat lozenges to help alleviate symptoms.  She declined a steroid injection or another steroid taper pack at this time.  She will follow-up with primary provider or return to the ED as needed. ____________________________________________  FINAL CLINICAL IMPRESSION(S) / ED DIAGNOSES  Final diagnoses:  Laryngitis, acute  Left ear pain  Viral pharyngitis      Devontre Siedschlag, Dannielle Karvonen, PA-C 11/11/17 [place], [place], MD 11/14/17 [phone]

## 2017-12-20 ENCOUNTER — Other Ambulatory Visit: Payer: Self-pay | Admitting: Obstetrics & Gynecology

## 2017-12-20 DIAGNOSIS — E559 Vitamin D deficiency, unspecified: Secondary | ICD-10-CM | POA: Diagnosis not present

## 2017-12-20 DIAGNOSIS — Z1239 Encounter for other screening for malignant neoplasm of breast: Secondary | ICD-10-CM | POA: Diagnosis not present

## 2017-12-20 DIAGNOSIS — N926 Irregular menstruation, unspecified: Secondary | ICD-10-CM | POA: Diagnosis not present

## 2017-12-20 DIAGNOSIS — Z01419 Encounter for gynecological examination (general) (routine) without abnormal findings: Secondary | ICD-10-CM | POA: Diagnosis not present

## 2017-12-20 DIAGNOSIS — Z1231 Encounter for screening mammogram for malignant neoplasm of breast: Secondary | ICD-10-CM

## 2017-12-23 ENCOUNTER — Encounter: Payer: Self-pay | Admitting: Primary Care

## 2017-12-23 ENCOUNTER — Ambulatory Visit (INDEPENDENT_AMBULATORY_CARE_PROVIDER_SITE_OTHER): Payer: 59 | Admitting: Primary Care

## 2017-12-23 VITALS — BP 122/84 | HR 78 | Temp 98.2°F | Ht 68.0 in | Wt 199.8 lb

## 2017-12-23 DIAGNOSIS — R42 Dizziness and giddiness: Secondary | ICD-10-CM | POA: Diagnosis not present

## 2017-12-23 DIAGNOSIS — D649 Anemia, unspecified: Secondary | ICD-10-CM | POA: Diagnosis not present

## 2017-12-23 DIAGNOSIS — R7989 Other specified abnormal findings of blood chemistry: Secondary | ICD-10-CM | POA: Diagnosis not present

## 2017-12-23 NOTE — Patient Instructions (Signed)
Start ferrous sulfate 325 mg once daily for anemia. This may cause constipation so please message me if this is the case.  Schedule a lab only appointment in 4 weeks to recheck blood levels and thyroid function.  It was a pleasure to meet you today! Please don't hesitate to call or message me with any questions. Welcome to [facility]!

## 2017-12-23 NOTE — Assessment & Plan Note (Signed)
Infrequent but will last for several days.  She does have anemia and low TSH that was discovered on recent labs.  Treat anemia. Repeat TSH and add Free T4 in 4 weeks. She will update if dizziness becomes more frequent.

## 2017-12-23 NOTE — Assessment & Plan Note (Signed)
Heavy menstrual cycles. Recent hemoglobin of 9.3. Will have her start oral ferrous sulfate 325 mg daily. Repeat CBC and add IBC panel in 4 weeks.

## 2017-12-23 NOTE — Assessment & Plan Note (Signed)
Noted on recent labs per GYN. Repeat TSH and add Free T4 in 4 weeks.

## 2017-12-23 NOTE — Progress Notes (Signed)
Subjective:    Patient ID: Maria [name], female    DOB: 1975-05-06, 42 y.o.   MRN: [id number]  HPI  Maria [name] is a 42 year old female who presents today to establish care and discuss the problems mentioned below. Will obtain old records.  1) Essential Hypertension: Diagnosed several years ago and was managed on medication in the past, doesn't remember which one. She denies headaches, chest pain.   BP Readings from Last 3 Encounters:  12/23/17 122/84  11/10/17 (!) 153/92  10/26/17 128/86   2) Dizziness: Occurs once every 6 months on average, will feel "off". This past Sunday her symptoms began with symptoms of the room spinning. Her work schedule has changed, she's working night shift which she started 2 months ago.   History of heavy menstrual periods that lasts 8-9 days, does lighten up for a few days. She does notice large blood clots. She had a month long menstrual cycle in August so she started taking OCP's. Since then her menstrual cycles haven't been as heavy but have been "off". She recently stopped her OCP's after 1.5 months of taking. Sometimes experiences palpitations.    She is not taking oral iron. Recent CBC per GYN with hemoglobin of 9.3, MCV normal, low MCH. TSH of 0.3.  Review of Systems  Respiratory: Negative for shortness of breath.   Cardiovascular: Negative for chest pain.       Intermittent palpitations  Endocrine: Negative for cold intolerance and heat intolerance.  Neurological: Negative for headaches.       Intermittent dizziness       Past Medical History:  Diagnosis Date  . History of UTI   . Hypertension      Social History   Socioeconomic History  . Marital status: Married    Spouse name: Not on file  . Number of children: Not on file  . Years of education: Not on file  . Highest education level: Not on file  Occupational History  . Not on file  Social Needs  . Financial resource strain: Not on file  . Food insecurity:   Worry: Not on file    Inability: Not on file  . Transportation needs:    Medical: Not on file    Non-medical: Not on file  Tobacco Use  . Smoking status: Never Smoker  . Smokeless tobacco: Never Used  Substance and Sexual Activity  . Alcohol use: No  . Drug use: No  . Sexual activity: Yes    Birth control/protection: None  Lifestyle  . Physical activity:    Days per week: Not on file    Minutes per session: Not on file  . Stress: Not on file  Relationships  . Social connections:    Talks on phone: Not on file    Gets together: Not on file    Attends religious service: Not on file    Active member of club or organization: Not on file    Attends meetings of clubs or organizations: Not on file    Relationship status: Not on file  . Intimate partner violence:    Fear of current or ex partner: Not on file    Emotionally abused: Not on file    Physically abused: Not on file    Forced sexual activity: Not on file  Other Topics Concern  . Not on file  Social History Narrative   Married.   2 children.   Works as an Passenger transport manager.   Enjoys relaxing.  Past Surgical History:  Procedure Laterality Date  . EYE SURGERY      Family History  Problem Relation Age of Onset  . Arthritis Maternal Grandmother   . Diabetes Maternal Grandmother   . Hypertension Maternal Grandmother   . Throat cancer Maternal Grandfather   . Birth defects Maternal Grandfather     No Known Allergies  No current outpatient medications on file prior to visit.   No current facility-administered medications on file prior to visit.     BP 122/84   Pulse 78   Temp 98.2 F (36.8 C) (Oral)   Ht 5\' 8"  (1.727 m)   Wt 199 lb 12 oz (90.6 kg)   LMP 12/13/2017   SpO2 94%   BMI 30.37 kg/m    Objective:   Physical Exam  Constitutional: She appears well-nourished.  Neck: Neck supple.  Cardiovascular: Normal rate and regular rhythm.  Respiratory: Effort normal and breath sounds normal.  Skin:  Skin is warm and dry.  Psychiatric: She has a normal mood and affect.           Assessment & Plan:

## 2018-01-04 ENCOUNTER — Telehealth: Payer: Self-pay

## 2018-01-04 NOTE — Telephone Encounter (Signed)
Pt called saying she has Athletes Feet and has tried [employer]. I suggested she try Lotramin Ultra Cream for a week and if that does not work, call and Anda Kraft can advise an OV or something else. Pt agreed.

## 2018-01-05 NOTE — Telephone Encounter (Signed)
Noted and agree. 

## 2018-01-20 ENCOUNTER — Other Ambulatory Visit: Payer: 59

## 2018-01-24 ENCOUNTER — Other Ambulatory Visit (INDEPENDENT_AMBULATORY_CARE_PROVIDER_SITE_OTHER): Payer: 59

## 2018-01-24 DIAGNOSIS — R7989 Other specified abnormal findings of blood chemistry: Secondary | ICD-10-CM

## 2018-01-24 DIAGNOSIS — D649 Anemia, unspecified: Secondary | ICD-10-CM | POA: Diagnosis not present

## 2018-01-24 LAB — CBC
HCT: 30.1 % — ABNORMAL LOW (ref 36.0–46.0)
HEMOGLOBIN: 9.6 g/dL — AB (ref 12.0–15.0)
MCHC: 31.8 g/dL (ref 30.0–36.0)
MCV: 79.6 fl (ref 78.0–100.0)
PLATELETS: 270 10*3/uL (ref 150.0–400.0)
RBC: 3.79 Mil/uL — ABNORMAL LOW (ref 3.87–5.11)
RDW: 20 % — AB (ref 11.5–15.5)
WBC: 4.6 10*3/uL (ref 4.0–10.5)

## 2018-01-24 LAB — TSH: TSH: 0.5 u[IU]/mL (ref 0.35–4.50)

## 2018-01-24 LAB — IBC PANEL
Iron: 11 ug/dL — ABNORMAL LOW (ref 42–145)
SATURATION RATIOS: 2.4 % — AB (ref 20.0–50.0)
TRANSFERRIN: 329 mg/dL (ref 212.0–360.0)

## 2018-01-24 LAB — T4, FREE: Free T4: 0.74 ng/dL (ref 0.60–1.60)

## 2018-02-02 ENCOUNTER — Other Ambulatory Visit: Payer: Self-pay | Admitting: Advanced Practice Midwife

## 2018-02-02 DIAGNOSIS — Z30011 Encounter for initial prescription of contraceptive pills: Secondary | ICD-10-CM

## 2018-02-02 NOTE — Telephone Encounter (Signed)
Advise

## 2018-02-14 DIAGNOSIS — G44211 Episodic tension-type headache, intractable: Secondary | ICD-10-CM | POA: Diagnosis not present

## 2018-02-14 DIAGNOSIS — H25042 Posterior subcapsular polar age-related cataract, left eye: Secondary | ICD-10-CM | POA: Diagnosis not present

## 2018-02-22 ENCOUNTER — Encounter: Payer: Self-pay | Admitting: *Deleted

## 2018-04-10 ENCOUNTER — Other Ambulatory Visit: Payer: Self-pay | Admitting: Advanced Practice Midwife

## 2018-04-10 DIAGNOSIS — Z30011 Encounter for initial prescription of contraceptive pills: Secondary | ICD-10-CM

## 2018-04-10 NOTE — Telephone Encounter (Signed)
advise

## 2018-06-23 ENCOUNTER — Encounter: Payer: Self-pay | Admitting: Primary Care

## 2018-06-23 ENCOUNTER — Ambulatory Visit (INDEPENDENT_AMBULATORY_CARE_PROVIDER_SITE_OTHER): Payer: 59 | Admitting: Primary Care

## 2018-06-23 DIAGNOSIS — F418 Other specified anxiety disorders: Secondary | ICD-10-CM | POA: Insufficient documentation

## 2018-06-23 DIAGNOSIS — F411 Generalized anxiety disorder: Secondary | ICD-10-CM | POA: Insufficient documentation

## 2018-06-23 NOTE — Assessment & Plan Note (Signed)
Since Covid-19, worse over last 2-3 weeks. It seems that she just needs extra time during her work day to process and work through the ongoing stress. She does appear stressed today.   Will agree to excuse her from work for 06/22/18, also will write her employer a letter requesting up to 10 hours per week of breaks if needed. We may need to complete FMLA paperwork.   Discussed use of Melatonin for sleep. Also Tylenol or Advil for headaches. She will update.

## 2018-06-23 NOTE — Progress Notes (Signed)
Subjective:    Patient ID: Maria [name], female    DOB: September 14, 1975, 43 y.o.   MRN: [id number]  HPI  Virtual Visit via Video Note  I connected with Maria [name] on 06/23/18 at  8:40 AM EDT by a video enabled telemedicine application and verified that I am speaking with the correct person using two identifiers.  Location: Patient: Home Provider: Office   I discussed the limitations of evaluation and management by telemedicine and the availability of in person appointments. The patient expressed understanding and agreed to proceed.  History of Present Illness:  Maria [name] is a 43 year old female who presents today with a chief complaint of anxiety and stress.   She also reports nightmares, difficulty sleeping, headaches, inability to handle stress well. She's under a tremendous amount of stress due to Covid-19. She works in Engineer, mining and is constantly talking with people on the phone, has to give a lot of bad news which is frequently poorly received. Once she's finished with work she has to handle the stresses of running a household. Prior to Covid-19 she had no problems with headaches, sleeping, anxiety, and stress. Symptoms began about 2-3 weeks ago and have become debilitating at times.   She's taken Advil PM last night and slept well. She starts her day at 10 am, has two 10 minute breaks throughout her 8 hour shift, stops working at 6 pm. She is requesting intermittent breaks when needed up to 1-2 hours per shift as her current breaks aren't long enough. She feels as though she needs to "unplug" during those times. She missed work yesterday due to the stress and is off today.    Observations/Objective:  Alert and oriented. Appears well, not sickly. No distress. Speaking in complete sentences.   Assessment and Plan:  See problem based charting  Follow Up Instructions:  You may take Melatonin 5-10 mg one hour prior to bedtime for sleep.  We will create a  letter for you to send to your employee for your additional breaks.  It was a pleasure to see you today! Allie Bossier, NP-C    I discussed the assessment and treatment plan with the patient. The patient was provided an opportunity to ask questions and all were answered. The patient agreed with the plan and demonstrated an understanding of the instructions.   The patient was advised to call back or seek an in-person evaluation if the symptoms worsen or if the condition fails to improve as anticipated.     Pleas Koch, NP    Review of Systems  Respiratory: Negative for shortness of breath.   Cardiovascular: Negative for chest pain.  Neurological: Positive for headaches.  Psychiatric/Behavioral: Positive for sleep disturbance. The patient is nervous/anxious.        Past Medical History:  Diagnosis Date  . History of UTI   . Hypertension      Social History   Socioeconomic History  . Marital status: Married    Spouse name: Not on file  . Number of children: Not on file  . Years of education: Not on file  . Highest education level: Not on file  Occupational History  . Not on file  Social Needs  . Financial resource strain: Not on file  . Food insecurity:    Worry: Not on file    Inability: Not on file  . Transportation needs:    Medical: Not on file    Non-medical: Not on file  Tobacco Use  .  Smoking status: Never Smoker  . Smokeless tobacco: Never Used  Substance and Sexual Activity  . Alcohol use: No  . Drug use: No  . Sexual activity: Yes    Birth control/protection: None  Lifestyle  . Physical activity:    Days per week: Not on file    Minutes per session: Not on file  . Stress: Not on file  Relationships  . Social connections:    Talks on phone: Not on file    Gets together: Not on file    Attends religious service: Not on file    Active member of club or organization: Not on file    Attends meetings of clubs or organizations: Not on file     Relationship status: Not on file  . Intimate partner violence:    Fear of current or ex partner: Not on file    Emotionally abused: Not on file    Physically abused: Not on file    Forced sexual activity: Not on file  Other Topics Concern  . Not on file  Social History Narrative   Married.   2 children.   Works as an Passenger transport manager.   Enjoys relaxing.     Past Surgical History:  Procedure Laterality Date  . EYE SURGERY      Family History  Problem Relation Age of Onset  . Arthritis Maternal Grandmother   . Diabetes Maternal Grandmother   . Hypertension Maternal Grandmother   . Throat cancer Maternal Grandfather   . Birth defects Maternal Grandfather     No Known Allergies  No current outpatient medications on file prior to visit.   No current facility-administered medications on file prior to visit.     LMP 06/23/2018    Objective:   Physical Exam  Constitutional: She is oriented to person, place, and time. She appears well-nourished.  Respiratory: Effort normal.  Neurological: She is alert and oriented to person, place, and time.  Psychiatric: She has a normal mood and affect.  Appears stressed/overwhelmed            Assessment & Plan:

## 2018-06-23 NOTE — Patient Instructions (Signed)
You may take Melatonin 5-10 mg one hour prior to bedtime for sleep.  We will create a letter for you to send to your employee for your additional breaks.  It was a pleasure to see you today! Allie Bossier, NP-C

## 2018-06-29 ENCOUNTER — Telehealth: Payer: Self-pay | Admitting: Primary Care

## 2018-06-29 NOTE — Telephone Encounter (Signed)
Left message asking pt to call office Please have pt refax fmla paperwork

## 2018-06-29 NOTE — Telephone Encounter (Signed)
No, I have not seen this. I just double checked my stack.

## 2018-06-29 NOTE — Telephone Encounter (Signed)
Noted, will be looking out for her forms. Please notify patient that she can try a medication called Unisom. It's an OTC sleep aid that may help. I do agree that she'll likely feel better with her FMLA.

## 2018-06-29 NOTE — Telephone Encounter (Signed)
Spoke with pt she will drop off forms  She also wanted to let you know the melatonin is not work and she is not sleeping what do you recommend?  She is still having headaches when she gets up and all through the day / she doesn't know how to handle this.  Stressful at work/ work/home life dealing with covid 19  She is hoping that when she get approved for intermittent leave this will help with some of the stress   Best number [phone]

## 2018-06-29 NOTE — Telephone Encounter (Signed)
Best number [phone] Pt called to see if you received fmla paperwork.  I have not see it have you?

## 2018-06-30 NOTE — Telephone Encounter (Signed)
Paperwork in [place] in box Pt wanted start date 06/22/2018

## 2018-06-30 NOTE — Telephone Encounter (Signed)
Left message asking pt to call office Let pt know I did received her fmla paperwork and wanted to know what date did she want this to start.  I seen in office note kate wrote note for 06/22/2018

## 2018-06-30 NOTE — Telephone Encounter (Signed)
Completed and handed to Robin. °

## 2018-06-30 NOTE — Telephone Encounter (Signed)
Spoken and notified patient of Kate Clark's comments. Patient verbalized understanding.  

## 2018-07-04 NOTE — Telephone Encounter (Signed)
On cart to be delivered by carrie

## 2018-07-04 NOTE — Telephone Encounter (Signed)
[facility] needed additional information  Please initial and date question 5 And part c question 3a

## 2018-07-04 NOTE — Telephone Encounter (Signed)
Completed and placed on Robins desk. 

## 2018-07-05 NOTE — Telephone Encounter (Signed)
Left message letting pt know her paperwork has been faxed Copy for pt Copy for scan

## 2018-08-10 ENCOUNTER — Telehealth: Payer: Self-pay | Admitting: Primary Care

## 2018-08-10 NOTE — Telephone Encounter (Signed)
Pt called wanting to extend FMLA to 11/15/2018 She state time has helped   Paperwork on cart to be delivered by carrie

## 2018-08-10 NOTE — Telephone Encounter (Signed)
Completed and placed on Robins desk. 

## 2018-08-11 NOTE — Telephone Encounter (Signed)
Maria [name] if you are ok with extending pt leave till 11/15/2018 please initial and date

## 2018-08-11 NOTE — Telephone Encounter (Signed)
Completed and will place on Robin's desk.

## 2018-08-11 NOTE — Telephone Encounter (Signed)
Paperwork faxed Pt aware  

## 2018-08-22 ENCOUNTER — Telehealth: Payer: Self-pay | Admitting: Primary Care

## 2018-08-22 NOTE — Telephone Encounter (Signed)
Paperwork faxed °

## 2018-08-22 NOTE — Telephone Encounter (Signed)
[facility] faxed fmla paperwork Paperwork needed to be refilled out could not up date last fmla   On cart to be delivered by carrie

## 2018-08-22 NOTE — Telephone Encounter (Signed)
Completed and placed on Robins desk. 

## 2018-08-23 NOTE — Telephone Encounter (Signed)
Pt aware.

## 2018-08-25 ENCOUNTER — Telehealth: Payer: Self-pay | Admitting: Primary Care

## 2018-08-25 NOTE — Telephone Encounter (Signed)
Pt dropped off fmla paperwork They need additional information.  She stated they are saying it looks like she had a pattern of missing on Thursday and they need your approval for this  Paperwork in kate's in box

## 2018-08-25 NOTE — Telephone Encounter (Signed)
Copy for pt °Copy for scan °

## 2018-08-25 NOTE — Telephone Encounter (Signed)
Shirlean Mylar, I signed and put this on your desk. Do you mind calling patient to notify her of this and ask if there is a particular reason for her missing Thursday? Please don't send over paper work until we hear from her.

## 2018-08-25 NOTE — Telephone Encounter (Signed)
Copy for scan 

## 2018-08-28 NOTE — Telephone Encounter (Signed)
Yes, okay to fax

## 2018-08-28 NOTE — Telephone Encounter (Signed)
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  She doesn't take all day.  She misses 30 min or hour to regroup.  She will be more aware of this   Do you still want me to fax paperwork??

## 2018-08-28 NOTE — Telephone Encounter (Signed)
Paperwork faxed °

## 2018-08-29 NOTE — Telephone Encounter (Signed)
Pt aware.

## 2018-09-05 NOTE — Telephone Encounter (Signed)
Copy for pt °Copy for scan °

## 2018-11-06 NOTE — Telephone Encounter (Signed)
Completed and placed on Robins desk. 

## 2018-11-06 NOTE — Telephone Encounter (Signed)
Pt called wanting to know if she could get her intermittent FMLA extended to end of year  If your ok with this initial and date

## 2018-11-07 NOTE — Telephone Encounter (Signed)
Paperwork faxed °

## 2018-11-16 NOTE — Telephone Encounter (Signed)
Left message asking pt to call office  Please let her know revised copy faxed 9/21 and there is a copy here for her  Copy for pt Copy for scan

## 2018-11-17 NOTE — Telephone Encounter (Signed)
Patient aware of message below.

## 2018-11-21 ENCOUNTER — Ambulatory Visit (INDEPENDENT_AMBULATORY_CARE_PROVIDER_SITE_OTHER): Payer: 59

## 2018-11-21 ENCOUNTER — Other Ambulatory Visit: Payer: Self-pay

## 2018-11-21 DIAGNOSIS — Z23 Encounter for immunization: Secondary | ICD-10-CM | POA: Diagnosis not present

## 2018-11-23 ENCOUNTER — Ambulatory Visit: Payer: 59

## 2018-12-28 ENCOUNTER — Other Ambulatory Visit: Payer: Self-pay | Admitting: Obstetrics & Gynecology

## 2018-12-28 DIAGNOSIS — Z1231 Encounter for screening mammogram for malignant neoplasm of breast: Secondary | ICD-10-CM

## 2019-01-31 NOTE — Telephone Encounter (Signed)
Patient needs virtual visit before I can sign/extend.

## 2019-01-31 NOTE — Telephone Encounter (Signed)
Pt called wanting to know if you would extend her FMLA to 07/16/2019  In [employer]ate[employer] in box to initial and date

## 2019-02-01 NOTE — Telephone Encounter (Signed)
Patient called back in regards to Extending FMLA./ Patient has scheduled virtual visit for Monday 12/21

## 2019-02-01 NOTE — Telephone Encounter (Signed)
Left message asking pt to call office see kate's comment

## 2019-02-05 ENCOUNTER — Ambulatory Visit (INDEPENDENT_AMBULATORY_CARE_PROVIDER_SITE_OTHER): Payer: 59 | Admitting: Primary Care

## 2019-02-05 DIAGNOSIS — F418 Other specified anxiety disorders: Secondary | ICD-10-CM | POA: Diagnosis not present

## 2019-02-05 NOTE — Progress Notes (Signed)
Subjective:    Patient ID: Maria [name], female    DOB: April 01, 1975, 43 y.o.   MRN: [id number]  HPI  Virtual Visit via Video Note  I connected with Maria [name] on 02/05/19 at  9:20 AM EST by a video enabled telemedicine application and verified that I am speaking with the correct person using two identifiers.  Location: Patient: Musician Provider: Office   I discussed the limitations of evaluation and management by telemedicine and the availability of in person appointments. The patient expressed understanding and agreed to proceed.  History of Present Illness:  Maria [name] is a 43 year old female with a history of situational anxiety who presents today to discuss extending FMLA.  She was last evaluated in early May 2020 with symptoms of anxiety and stress including nightmares, headaches, inability to handle stress. She endorsed a tremendous amount of stress due to Covid-19, feeling overloaded with work. Given these symptoms she requested intermittent breaks during her work day, 1-2 hours per shift, this was granted. She is being seen today as she recently requested extension of these breaks.  Since her last visit she's doing much better. She's noticed less tension and stress overall with work. She's found that she's utilizing her breaks infrequently over the last several months. She would like her FMLA/breaks extended as she feels better knowing that she has the option of taking them if needed. She would like to extend for another six months to get through the Spring 2021 school year.    Observations/Objective:  Alert and oriented. Appears well, not sickly. No distress. Speaking in complete sentences.  Assessment and Plan:  Appears much better since last visit. Agree to extend FMLA to allow for more frequent work breaks if needed. Will extend through the end of June 2020. Paperwork signed and handed to front office staff.  Follow Up Instructions:  We will  extend your FMLA through the end of June 2021.  It was a pleasure to see you today! Allie Bossier, NP-C    I discussed the assessment and treatment plan with the patient. The patient was provided an opportunity to ask questions and all were answered. The patient agreed with the plan and demonstrated an understanding of the instructions.   The patient was advised to call back or seek an in-person evaluation if the symptoms worsen or if the condition fails to improve as anticipated.   Pleas Koch, NP    Review of Systems  Psychiatric/Behavioral:       See HPI       Past Medical History:  Diagnosis Date  . History of UTI   . Hypertension      Social History   Socioeconomic History  . Marital status: Married    Spouse name: Not on file  . Number of children: Not on file  . Years of education: Not on file  . Highest education level: Not on file  Occupational History  . Not on file  Tobacco Use  . Smoking status: Never Smoker  . Smokeless tobacco: Never Used  Substance and Sexual Activity  . Alcohol use: No  . Drug use: No  . Sexual activity: Yes    Birth control/protection: None  Other Topics Concern  . Not on file  Social History Narrative   Married.   2 children.   Works as an Passenger transport manager.   Enjoys relaxing.    Social Determinants of Health   Financial Resource Strain:   . Difficulty of  Paying Living Expenses: Not on file  Food Insecurity:   . Worried About Charity fundraiser in the Last Year: Not on file  . Ran Out of Food in the Last Year: Not on file  Transportation Needs:   . Lack of Transportation (Medical): Not on file  . Lack of Transportation (Non-Medical): Not on file  Physical Activity:   . Days of Exercise per Week: Not on file  . Minutes of Exercise per Session: Not on file  Stress:   . Feeling of Stress : Not on file  Social Connections:   . Frequency of Communication with Friends and Family: Not on file  . Frequency of Social  Gatherings with Friends and Family: Not on file  . Attends Religious Services: Not on file  . Active Member of Clubs or Organizations: Not on file  . Attends Archivist Meetings: Not on file  . Marital Status: Not on file  Intimate Partner Violence:   . Fear of Current or Ex-Partner: Not on file  . Emotionally Abused: Not on file  . Physically Abused: Not on file  . Sexually Abused: Not on file    Past Surgical History:  Procedure Laterality Date  . EYE SURGERY      Family History  Problem Relation Age of Onset  . Arthritis Maternal Grandmother   . Diabetes Maternal Grandmother   . Hypertension Maternal Grandmother   . Throat cancer Maternal Grandfather   . Birth defects Maternal Grandfather     No Known Allergies  No current outpatient medications on file prior to visit.   No current facility-administered medications on file prior to visit.    There were no vitals taken for this visit.   Objective:   Physical Exam  Constitutional: She is oriented to person, place, and time. She appears well-nourished.  Respiratory: Effort normal.  Neurological: She is alert and oriented to person, place, and time.  Psychiatric: She has a normal mood and affect.           Assessment & Plan:

## 2019-02-05 NOTE — Assessment & Plan Note (Signed)
Appears much better since last visit. Agree to extend FMLA to allow for more frequent work breaks if needed. Will extend through the end of June 2020. Paperwork signed and handed to front office staff.

## 2019-02-05 NOTE — Patient Instructions (Signed)
We will extend your FMLA through the end of June 2021.  It was a pleasure to see you today! Allie Bossier, NP-C

## 2019-02-20 ENCOUNTER — Inpatient Hospital Stay: Payer: 59 | Admitting: Oncology

## 2019-02-28 ENCOUNTER — Telehealth: Payer: Self-pay | Admitting: Primary Care

## 2019-02-28 NOTE — Telephone Encounter (Signed)
Patient returned Robin's call.  I let her know Robin's comments.

## 2019-02-28 NOTE — Telephone Encounter (Signed)
Left message asking pt to call office  I spoke with Wonday @ [facility] [employer] regarding her fmla   Anda Kraft had put end date of 08/15/2018 needed to be 08/15/2019 I was able to do a verbal change and paperwork has been corrected to 08/15/2019

## 2019-03-09 ENCOUNTER — Inpatient Hospital Stay: Payer: 59 | Admitting: Oncology

## 2019-03-22 ENCOUNTER — Telehealth: Payer: Self-pay | Admitting: Oncology

## 2019-03-22 NOTE — Telephone Encounter (Signed)
Spoke with patient on 03-22-19. Patient requested to move patient's new patient appt. Appt moved to 03-30-19. Patient is aware.

## 2019-03-23 ENCOUNTER — Inpatient Hospital Stay: Payer: 59 | Admitting: Oncology

## 2019-03-30 ENCOUNTER — Other Ambulatory Visit: Payer: Self-pay

## 2019-03-30 ENCOUNTER — Inpatient Hospital Stay: Payer: 59 | Attending: Oncology | Admitting: Oncology

## 2019-03-30 ENCOUNTER — Encounter: Payer: Self-pay | Admitting: Oncology

## 2019-03-30 ENCOUNTER — Inpatient Hospital Stay: Payer: 59

## 2019-03-30 VITALS — BP 159/100 | HR 89 | Temp 97.6°F | Ht 68.0 in | Wt 182.0 lb

## 2019-03-30 DIAGNOSIS — D509 Iron deficiency anemia, unspecified: Secondary | ICD-10-CM

## 2019-03-30 DIAGNOSIS — I10 Essential (primary) hypertension: Secondary | ICD-10-CM | POA: Diagnosis not present

## 2019-03-30 DIAGNOSIS — N926 Irregular menstruation, unspecified: Secondary | ICD-10-CM | POA: Insufficient documentation

## 2019-03-30 LAB — CBC WITH DIFFERENTIAL/PLATELET
Abs Immature Granulocytes: 0.03 10*3/uL (ref 0.00–0.07)
Basophils Absolute: 0.1 10*3/uL (ref 0.0–0.1)
Basophils Relative: 1 %
Eosinophils Absolute: 0.1 10*3/uL (ref 0.0–0.5)
Eosinophils Relative: 2 %
HCT: 35.7 % — ABNORMAL LOW (ref 36.0–46.0)
Hemoglobin: 10.7 g/dL — ABNORMAL LOW (ref 12.0–15.0)
Immature Granulocytes: 1 %
Lymphocytes Relative: 32 %
Lymphs Abs: 1.8 10*3/uL (ref 0.7–4.0)
MCH: 26.4 pg (ref 26.0–34.0)
MCHC: 30 g/dL (ref 30.0–36.0)
MCV: 88.1 fL (ref 80.0–100.0)
Monocytes Absolute: 0.5 10*3/uL (ref 0.1–1.0)
Monocytes Relative: 9 %
Neutro Abs: 3.2 10*3/uL (ref 1.7–7.7)
Neutrophils Relative %: 55 %
Platelets: 253 10*3/uL (ref 150–400)
RBC: 4.05 MIL/uL (ref 3.87–5.11)
RDW: 16.7 % — ABNORMAL HIGH (ref 11.5–15.5)
WBC: 5.7 10*3/uL (ref 4.0–10.5)
nRBC: 0 % (ref 0.0–0.2)

## 2019-03-30 LAB — URINALYSIS, COMPLETE (UACMP) WITH MICROSCOPIC
Bacteria, UA: NONE SEEN
Bilirubin Urine: NEGATIVE
Glucose, UA: NEGATIVE mg/dL
Hgb urine dipstick: NEGATIVE
Ketones, ur: NEGATIVE mg/dL
Leukocytes,Ua: NEGATIVE
Nitrite: NEGATIVE
Protein, ur: NEGATIVE mg/dL
Specific Gravity, Urine: 1.004 — ABNORMAL LOW (ref 1.005–1.030)
pH: 6 (ref 5.0–8.0)

## 2019-03-30 LAB — RETICULOCYTES
Immature Retic Fract: 23.4 % — ABNORMAL HIGH (ref 2.3–15.9)
RBC.: 4.04 MIL/uL (ref 3.87–5.11)
Retic Count, Absolute: 77.6 10*3/uL (ref 19.0–186.0)
Retic Ct Pct: 1.9 % (ref 0.4–3.1)

## 2019-03-30 LAB — LACTATE DEHYDROGENASE: LDH: 131 U/L (ref 98–192)

## 2019-03-30 LAB — FERRITIN: Ferritin: 4 ng/mL — ABNORMAL LOW (ref 11–307)

## 2019-03-30 LAB — IRON AND TIBC
Iron: 42 ug/dL (ref 28–170)
Saturation Ratios: 10 % — ABNORMAL LOW (ref 10.4–31.8)
TIBC: 423 ug/dL (ref 250–450)
UIBC: 381 ug/dL

## 2019-03-30 NOTE — Progress Notes (Signed)
Patient is here to establish care for her iron deficiency anemia. Patient also stated that she has frequent urination.

## 2019-03-31 LAB — HAPTOGLOBIN: Haptoglobin: 127 mg/dL (ref 42–296)

## 2019-04-02 NOTE — Progress Notes (Signed)
Hematology/Oncology Consult note [facility] Telephone:([phone] [phone]  Patient Care Team: Pleas Koch, NP as PCP - General (Internal Medicine)   Name of the patient: Maria [name]  [id number]  1976/01/01    Reason for referral-iron deficiency anemia   Referring physician-Dr. Vikki Ports Ward  Date of visit: 04/02/19   History of presenting illness- Patient is a 44 year old African-American female referred for iron deficiency anemia.  She has been having irregular menstrual cycles which can be at times heavy.  Denies any consistent use of NSAIDs.  Denies any family history of colon cancer.  Denies any blood in her stool or urine.  Recent CBC from 01/03/2019 showed white count of 4.8, H&H of 10.7/33.9 with an MCV of 89.7 and a platelet count of 205.  TSH was normal.  Celiac disease panel was negative.  Ferritin levels were low at 3 and TIBC elevated at 410.  Iron saturation was low at 8%.  B12 levels were normal at 333.  Has not had any endoscopy or colonoscopy so far.  ECOG PS- 0  Pain scale- 0   Review of systems- Review of Systems  Constitutional: Negative for chills, fever, malaise/fatigue and weight loss.  HENT: Negative for congestion, ear discharge and nosebleeds.   Eyes: Negative for blurred vision.  Respiratory: Negative for cough, hemoptysis, sputum production, shortness of breath and wheezing.   Cardiovascular: Negative for chest pain, palpitations, orthopnea and claudication.  Gastrointestinal: Negative for abdominal pain, blood in stool, constipation, diarrhea, heartburn, melena, nausea and vomiting.  Genitourinary: Negative for dysuria, flank pain, frequency, hematuria and urgency.  Musculoskeletal: Negative for back pain, joint pain and myalgias.  Skin: Negative for rash.  Neurological: Negative for dizziness, tingling, focal weakness, seizures, weakness and headaches.  Endo/Heme/Allergies: Does not bruise/bleed  easily.  Psychiatric/Behavioral: Negative for depression and suicidal ideas. The patient does not have insomnia.     No Known Allergies  Patient Active Problem List   Diagnosis Date Noted  . Situational anxiety 06/23/2018  . Anemia 12/23/2017  . Abnormal TSH 12/23/2017  . Dizziness 12/23/2017     Past Medical History:  Diagnosis Date  . History of UTI   . Hypertension      Past Surgical History:  Procedure Laterality Date  . EYE SURGERY      Social History   Socioeconomic History  . Marital status: Married    Spouse name: Not on file  . Number of children: Not on file  . Years of education: Not on file  . Highest education level: Not on file  Occupational History  . Not on file  Tobacco Use  . Smoking status: Never Smoker  . Smokeless tobacco: Never Used  Substance and Sexual Activity  . Alcohol use: No  . Drug use: No  . Sexual activity: Yes    Birth control/protection: None  Other Topics Concern  . Not on file  Social History Narrative   Married.   2 children.   Works as an Passenger transport manager.   Enjoys relaxing.    Social Determinants of Health   Financial Resource Strain:   . Difficulty of Paying Living Expenses: Not on file  Food Insecurity:   . Worried About Charity fundraiser in the Last Year: Not on file  . Ran Out of Food in the Last Year: Not on file  Transportation Needs:   . Lack of Transportation (Medical): Not on file  . Lack of Transportation (Non-Medical): Not on file  Physical  Activity:   . Days of Exercise per Week: Not on file  . Minutes of Exercise per Session: Not on file  Stress:   . Feeling of Stress : Not on file  Social Connections:   . Frequency of Communication with Friends and Family: Not on file  . Frequency of Social Gatherings with Friends and Family: Not on file  . Attends Religious Services: Not on file  . Active Member of Clubs or Organizations: Not on file  . Attends Archivist Meetings: Not on file  .  Marital Status: Not on file  Intimate Partner Violence:   . Fear of Current or Ex-Partner: Not on file  . Emotionally Abused: Not on file  . Physically Abused: Not on file  . Sexually Abused: Not on file     Family History  Problem Relation Age of Onset  . Arthritis Maternal Grandmother   . Diabetes Maternal Grandmother   . Hypertension Maternal Grandmother   . Throat cancer Maternal Grandfather   . Birth defects Maternal Grandfather      Current Outpatient Medications:  Marland Kitchen  Vitamin D, Ergocalciferol, (DRISDOL) 1.25 MG (50000 UNIT) CAPS capsule, Take 1 capsule by mouth daily., Disp: , Rfl:    Physical exam:  Vitals:   03/30/19 1503  BP: (!) 159/100  Pulse: 89  Temp: 97.6 F (36.4 C)  TempSrc: Tympanic  Weight: 182 lb (82.6 kg)  Height: 5\' 8"  (1.727 m)   Physical Exam Constitutional:      General: She is not in acute distress. HENT:     Head: Normocephalic and atraumatic.  Eyes:     Pupils: Pupils are equal, round, and reactive to light.  Cardiovascular:     Rate and Rhythm: Normal rate and regular rhythm.     Heart sounds: Normal heart sounds.  Pulmonary:     Effort: Pulmonary effort is normal.     Breath sounds: Normal breath sounds.  Abdominal:     General: Bowel sounds are normal.     Palpations: Abdomen is soft.  Musculoskeletal:     Cervical back: Normal range of motion.  Skin:    General: Skin is warm and dry.  Neurological:     Mental Status: She is alert and oriented to person, place, and time.        CMP Latest Ref Rng & Units 07/22/2016  Glucose 65 - 99 mg/dL 99  BUN 6 - 20 mg/dL 9  Creatinine 0.44 - 1.00 mg/dL 0.98  Sodium 135 - 145 mmol/L 136  Potassium 3.5 - 5.1 mmol/L 3.8  Chloride 101 - 111 mmol/L 105  CO2 22 - 32 mmol/L 27  Calcium 8.9 - 10.3 mg/dL 9.0   CBC Latest Ref Rng & Units 03/30/2019  WBC 4.0 - 10.5 K/uL 5.7  Hemoglobin 12.0 - 15.0 g/dL 10.7(L)  Hematocrit 36.0 - 46.0 % 35.7(L)  Platelets 150 - 400 K/uL 253      Assessment and plan- Patient is a 45 y.o. female referred for iron deficiency anemia  With regards to etiology of iron deficiency anemia it is possible that her irregular menstrual cycles are contributing to iron deficiency.  However given her age and African-American ethnicity I would like to refer her to GI for endoscopy evaluation as well.  I will repeat her anemia labs at this time given that her previous labs were done in November and I will check a CBC ferritin and iron studies reticulocyte count haptoglobin as well as LDH and urinalysis.  We discussed oral versus IV iron.  Patient would like to try oral iron at this time.  She will take oral iron 325 mg twice a day.  If she is unable to tolerate oral iron because of abdominal pain or constipation she will reach out to [place] and we will switch her to IV iron at that time.  Repeat CBC ferritin and iron studies in 2 months followed by a video visit.  If her iron levels continue to be low at that time we will switch her to IV iron.  Patient verbalized understanding   Thank you for this kind referral and the opportunity to participate in the care of this patient   Visit Diagnosis 1. Iron deficiency anemia, unspecified iron deficiency anemia type     Dr. Randa Evens, MD, MPH [facility] at [facility] [id number] 04/02/2019  3:26 PM

## 2019-04-04 ENCOUNTER — Telehealth: Payer: Self-pay | Admitting: *Deleted

## 2019-04-04 NOTE — Telephone Encounter (Signed)
Patient called asking for someone to go over her lab results. Please return her call [phone]. CBC with Differential/Platelet Order: [id number]  Status: Final result  Visible to patient: Yes (MyChart)  Next appt: 04/17/2019 at 08:00 AM in Radiology ([facility])  Dx: Iron deficiency anemia, unspecified i...    Ref Range & Units 5 d ago  WBC 4.0 - 10.5 K/uL 5.7   RBC 3.87 - 5.11 MIL/uL 4.05   Hemoglobin 12.0 - 15.0 g/dL 10.7  HCT 36.0 - 46.0 % 35.7  MCV 80.0 - 100.0 fL 88.1   MCH 26.0 - 34.0 pg 26.4   MCHC 30.0 - 36.0 g/dL 30.0   RDW 11.5 - 15.5 % 16.7  Platelets 150 - 400 K/uL 253   nRBC 0.0 - 0.2 % 0.0   Neutrophils Relative % % 55   Neutro Abs 1.7 - 7.7 K/uL 3.2   Lymphocytes Relative % 32   Lymphs Abs 0.7 - 4.0 K/uL 1.8   Monocytes Relative % 9   Monocytes Absolute 0.1 - 1.0 K/uL 0.5   Eosinophils Relative % 2   Eosinophils Absolute 0.0 - 0.5 K/uL 0.1   Basophils Relative % 1   Basophils Absolute 0.0 - 0.1 K/uL 0.1   Immature Granulocytes % 1   Abs Immature Granulocytes 0.00 - 0.07 K/uL 0.03   Comment: Performed at [facility], [address] [place], [place] [address]  Resulting Agency  [facility]     Specimen Collected: 03/30/19 15:41   Last Resulted: 03/30/19 16:00   Lab Flowsheet  Order Details  View Encounter  Lab and Collection Details  Routing  Result History      Other Results from 03/30/2019 Haptoglobin  Status: Final result  Visible to patient: Yes ([place])  Next appt: 04/17/2019 at 08:00 AM in Radiology ([facility])  Dx: Iron deficiency anemia, unspecified i...  Order: [id number]    Ref Range & Units 5 d ago  Haptoglobin 42 - 296 mg/dL 127   Comment: (NOTE)  Performed At: [facility]  [place], [place] [id number]  Rush Farmer MD [id number]    Resulting Agency  [facility]     Specimen Collected: 03/30/19 15:41   Last Resulted: 03/31/19 04:36   Lab Flowsheet  Order Details  View Encounter  Lab and  Collection Details  Routing  Result History         Reticulocytes  Status: Final result  Visible to patient: Yes (MyChart)  Next appt: 04/17/2019 at 08:00 AM in Radiology ([facility])  Dx: Iron deficiency anemia, unspecified i...  Order: [id number]    Ref Range & Units 5 d ago  Retic Ct Pct 0.4 - 3.1 % 1.9   RBC. 3.87 - 5.11 MIL/uL 4.04   Retic Count, Absolute 19.0 - 186.0 K/uL 77.6   Immature Retic Fract 2.3 - 15.9 % 23.4  Comment: Performed at [facility], [address] [place], [place] [address]  Resulting Agency  [facility]     Specimen Collected: 03/30/19 15:41   Last Resulted: 03/30/19 16:00   Lab Flowsheet  Order Details  View Encounter  Lab and Collection Details  Routing  Result History         Iron and TIBC  Status: Final result  Visible to patient: Yes (MyChart)  Next appt: 04/17/2019 at 08:00 AM in Radiology ([facility])  Dx: Iron deficiency anemia, unspecified i...  Order: [id number]    Ref Range & Units 5 d  ago  Iron 28 - 170 ug/dL 42   TIBC 250 - 450 ug/dL 423   Saturation Ratios 10.4 - 31.8 % 10  UIBC ug/dL 381   Comment: Performed at [facility], [address] [place], [place] [address]  Resulting Agency  [facility] LAB     Specimen Collected: 03/30/19 15:41   Last Resulted: 03/30/19 17:00   Lab Flowsheet  Order Details  View Encounter  Lab and Collection Details  Routing  Result History         Ferritin  Status: Final result  Visible to patient: Yes (MyChart)  Next appt: 04/17/2019 at 08:00 AM in Radiology ([facility])  Dx: Iron deficiency anemia, unspecified i...  Order: [id number]    Ref Range & Units 5 d ago  Ferritin 11 - 307 ng/mL 4  Comment: Performed at [facility], [address] [place], [place] [address]  Resulting Agency  [facility] LAB     Specimen Collected: 03/30/19 15:41   Last Resulted: 03/30/19 17:00   Lab Flowsheet  Order Details  View Encounter  Lab and Collection Details   Routing  Result History         Lactate dehydrogenase  Status: Final result  Visible to patient: Yes (MyChart)  Next appt: 04/17/2019 at 08:00 AM in Radiology ([facility])  Dx: Iron deficiency anemia, unspecified i...  Order: [id number]    Ref Range & Units 5 d ago  LDH 98 - 192 U/L 131   Comment: Performed at [facility], [address] [place], [place] [address]  Resulting Agency  [facility] CLIN LAB     Specimen Collected: 03/30/19 15:41   Last Resulted: 03/30/19 16:09   Lab Flowsheet  Order Details  View Encounter  Lab and Collection Details  Routing  Result History         Urinalysis, Complete w Microscopic  Status: Final result  Visible to patient: Yes (MyChart)  Next appt: 04/17/2019 at 08:00 AM in Radiology ([facility])  Dx: Iron deficiency anemia, unspecified i...  Order: [id number]    Ref Range & Units 5 d ago  Color, Urine YELLOW STRAW  APPearance CLEAR CLEAR  Specific Gravity, Urine 1.005 - 1.030 1.004  pH 5.0 - 8.0 6.0   Glucose, UA NEGATIVE mg/dL NEGATIVE   Hgb urine dipstick NEGATIVE NEGATIVE   Bilirubin Urine NEGATIVE NEGATIVE   Ketones, ur NEGATIVE mg/dL NEGATIVE   Protein, ur NEGATIVE mg/dL NEGATIVE   Nitrite NEGATIVE NEGATIVE   Leukocytes,Ua NEGATIVE NEGATIVE   RBC / HPF 0 - 5 RBC/hpf 0-5   WBC, UA 0 - 5 WBC/hpf 0-5   Bacteria, UA NONE SEEN NONE SEEN   Squamous Epithelial / LPF 0 - 5 0-5   Comment: Performed at [facility], [address] [place],  [address]  Resulting Agency  [facility] CLIN LAB     Specimen Collected: 03/30/19 15:41   Last Resulted: 03/30/19 16:31

## 2019-04-06 ENCOUNTER — Encounter: Payer: Self-pay | Admitting: Oncology

## 2019-04-06 ENCOUNTER — Encounter: Payer: Self-pay | Admitting: *Deleted

## 2019-04-06 NOTE — Telephone Encounter (Signed)
Unable to again get hold of her today. I have sent her detailed my chart message

## 2019-04-19 ENCOUNTER — Ambulatory Visit
Admission: RE | Admit: 2019-04-19 | Discharge: 2019-04-19 | Disposition: A | Discharge status: Home / Self Care | Payer: 59 | Source: Ambulatory Visit | Attending: Obstetrics & Gynecology | Admitting: Obstetrics & Gynecology

## 2019-04-19 DIAGNOSIS — Z1231 Encounter for screening mammogram for malignant neoplasm of breast: Secondary | ICD-10-CM | POA: Insufficient documentation

## 2019-05-28 ENCOUNTER — Inpatient Hospital Stay: Payer: 59 | Attending: Oncology

## 2019-05-28 DIAGNOSIS — D509 Iron deficiency anemia, unspecified: Secondary | ICD-10-CM | POA: Insufficient documentation

## 2019-05-29 ENCOUNTER — Inpatient Hospital Stay: Payer: 59 | Admitting: Oncology

## 2019-05-30 ENCOUNTER — Other Ambulatory Visit: Payer: 59

## 2019-05-31 ENCOUNTER — Inpatient Hospital Stay: Payer: 59

## 2019-05-31 ENCOUNTER — Other Ambulatory Visit: Payer: Self-pay

## 2019-05-31 DIAGNOSIS — D509 Iron deficiency anemia, unspecified: Secondary | ICD-10-CM | POA: Diagnosis not present

## 2019-05-31 LAB — CBC WITH DIFFERENTIAL/PLATELET
Abs Immature Granulocytes: 0.03 10*3/uL (ref 0.00–0.07)
Basophils Absolute: 0 10*3/uL (ref 0.0–0.1)
Basophils Relative: 1 %
Eosinophils Absolute: 0.1 10*3/uL (ref 0.0–0.5)
Eosinophils Relative: 2 %
HCT: 37.6 % (ref 36.0–46.0)
Hemoglobin: 12.3 g/dL (ref 12.0–15.0)
Immature Granulocytes: 1 %
Lymphocytes Relative: 26 %
Lymphs Abs: 1.3 10*3/uL (ref 0.7–4.0)
MCH: 28.4 pg (ref 26.0–34.0)
MCHC: 32.7 g/dL (ref 30.0–36.0)
MCV: 86.8 fL (ref 80.0–100.0)
Monocytes Absolute: 0.6 10*3/uL (ref 0.1–1.0)
Monocytes Relative: 12 %
Neutro Abs: 2.8 10*3/uL (ref 1.7–7.7)
Neutrophils Relative %: 58 %
Platelets: 165 10*3/uL (ref 150–400)
RBC: 4.33 MIL/uL (ref 3.87–5.11)
RDW: 19.4 % — ABNORMAL HIGH (ref 11.5–15.5)
WBC: 4.7 10*3/uL (ref 4.0–10.5)
nRBC: 0 % (ref 0.0–0.2)

## 2019-05-31 LAB — IRON AND TIBC
Iron: 71 ug/dL (ref 28–170)
Saturation Ratios: 21 % (ref 10.4–31.8)
TIBC: 333 ug/dL (ref 250–450)
UIBC: 262 ug/dL

## 2019-05-31 LAB — FERRITIN: Ferritin: 9 ng/mL — ABNORMAL LOW (ref 11–307)

## 2019-06-01 ENCOUNTER — Inpatient Hospital Stay (HOSPITAL_BASED_OUTPATIENT_CLINIC_OR_DEPARTMENT_OTHER): Payer: 59 | Admitting: Oncology

## 2019-06-01 ENCOUNTER — Encounter: Payer: Self-pay | Admitting: Oncology

## 2019-06-01 DIAGNOSIS — D509 Iron deficiency anemia, unspecified: Secondary | ICD-10-CM

## 2019-06-05 NOTE — Progress Notes (Signed)
I connected with Maria [name] on 06/05/19 at  2:45 PM EDT by video enabled telemedicine visit and verified that I am speaking with the correct person using two identifiers.   I discussed the limitations, risks, security and privacy concerns of performing an evaluation and management service by telemedicine and the availability of in-person appointments. I also discussed with the patient that there may be a patient responsible charge related to this service. The patient expressed understanding and agreed to proceed.  Other persons participating in the visit and their role in the encounter: None  Patient's location: work Provider's location: Work  Risk analyst Complaint: Routine follow-up of iron deficiency anemia  History of present illness: deficiency anemia.  She has been having irregular menstrual cycles which can be at times heavy.  Denies any consistent use of NSAIDs.  Denies any family history of colon cancer.  Denies any blood in her stool or urine.  Recent CBC from 01/03/2019 showed white count of 4.8, H&H of 10.7/33.9 with an MCV of 89.7 and a platelet count of 205.  TSH was normal.  Celiac disease panel was negative.  Ferritin levels were low at 3 and TIBC elevated at 410.  Iron saturation was low at 8%.  B12 levels were normal at 333.  Has not had any endoscopy or colonoscopy so far.  Interval history: Reports that her menstrual cycles are not presently that heavy.  Denies any blood loss in her stool or urine.  Denies any dark melanotic stools   Review of Systems  Constitutional: Negative for chills, fever, malaise/fatigue and weight loss.  HENT: Negative for congestion, ear discharge and nosebleeds.   Eyes: Negative for blurred vision.  Respiratory: Negative for cough, hemoptysis, sputum production, shortness of breath and wheezing.   Cardiovascular: Negative for chest pain, palpitations, orthopnea and claudication.  Gastrointestinal: Negative for abdominal pain, blood in stool,  constipation, diarrhea, heartburn, melena, nausea and vomiting.  Genitourinary: Negative for dysuria, flank pain, frequency, hematuria and urgency.  Musculoskeletal: Negative for back pain, joint pain and myalgias.  Skin: Negative for rash.  Neurological: Negative for dizziness, tingling, focal weakness, seizures, weakness and headaches.  Endo/Heme/Allergies: Does not bruise/bleed easily.  Psychiatric/Behavioral: Negative for depression and suicidal ideas. The patient does not have insomnia.     No Known Allergies  Past Medical History:  Diagnosis Date  . History of UTI   . Hypertension     Past Surgical History:  Procedure Laterality Date  . EYE SURGERY      Social History   Socioeconomic History  . Marital status: Married    Spouse name: Not on file  . Number of children: Not on file  . Years of education: Not on file  . Highest education level: Not on file  Occupational History  . Not on file  Tobacco Use  . Smoking status: Never Smoker  . Smokeless tobacco: Never Used  Substance and Sexual Activity  . Alcohol use: No  . Drug use: No  . Sexual activity: Yes    Birth control/protection: None  Other Topics Concern  . Not on file  Social History Narrative   Married.   2 children.   Works as an Passenger transport manager.   Enjoys relaxing.    Social Determinants of Health   Financial Resource Strain:   . Difficulty of Paying Living Expenses:   Food Insecurity:   . Worried About Charity fundraiser in the Last Year:   . [place] in the Last Year:  Transportation Needs:   . Film/video editor (Medical):   Marland Kitchen Lack of Transportation (Non-Medical):   Physical Activity:   . Days of Exercise per Week:   . Minutes of Exercise per Session:   Stress:   . Feeling of Stress :   Social Connections:   . Frequency of Communication with Friends and Family:   . Frequency of Social Gatherings with Friends and Family:   . Attends Religious Services:   . Active Member  of Clubs or Organizations:   . Attends Archivist Meetings:   Marland Kitchen Marital Status:   Intimate Partner Violence:   . Fear of Current or Ex-Partner:   . Emotionally Abused:   Marland Kitchen Physically Abused:   . Sexually Abused:     Family History  Problem Relation Age of Onset  . Arthritis Maternal Grandmother   . Diabetes Maternal Grandmother   . Hypertension Maternal Grandmother   . Throat cancer Maternal Grandfather   . Birth defects Maternal Grandfather   . Breast cancer Neg Hx      Current Outpatient Medications:  .  ergocalciferol (VITAMIN D2) 1.25 MG (50000 UT) capsule, Take 1 capsule by mouth once a week., Disp: , Rfl:  .  Cholecalciferol (VITAMIN D3) 1.25 MG (50000 UT) CAPS, Take 1 capsule by mouth daily., Disp: , Rfl:  .  metaxalone (SKELAXIN) 800 MG tablet, Take 800 mg by mouth every 8 (eight) hours as needed., Disp: , Rfl:  .  sulfamethoxazole-trimethoprim (BACTRIM DS) 800-160 MG tablet, Take 1 tablet by mouth 2 (two) times daily., Disp: , Rfl:   No results found.  No images are attached to the encounter.   CMP Latest Ref Rng & Units 07/22/2016  Glucose 65 - 99 mg/dL 99  BUN 6 - 20 mg/dL 9  Creatinine 0.44 - 1.00 mg/dL 0.98  Sodium 135 - 145 mmol/L 136  Potassium 3.5 - 5.1 mmol/L 3.8  Chloride 101 - 111 mmol/L 105  CO2 22 - 32 mmol/L 27  Calcium 8.9 - 10.3 mg/dL 9.0   CBC Latest Ref Rng & Units 05/31/2019  WBC 4.0 - 10.5 K/uL 4.7  Hemoglobin 12.0 - 15.0 g/dL 12.3  Hematocrit 36.0 - 46.0 % 37.6  Platelets 150 - 400 K/uL 165     Observation/objective: Appears in no acute distress over video visit today.  Breathing is nonlabored  Assessment and plan: Patient is a 44 year old female with history of iron deficiency anemia here for routine follow-up  Patient's hemoglobin is improved to 12.3 from a prior value of 10.7.  Iron studies still show some evidence of iron deficiency given that her ferritin is 9 but her iron saturation and TIBC is better.  She is continuing  to tolerate oral iron well without any significant side effects.  I will therefore hold off on giving her any IV iron at this time.  I will repeat CBC ferritin and iron studies in 4 and 8 months and see her back in 8 months.  If she has continued evidence of iron deficiency perhaps IV iron will be considered at that time  Follow-up instructions: As above  I discussed the assessment and treatment plan with the patient. The patient was provided an opportunity to ask questions and all were answered. The patient agreed with the plan and demonstrated an understanding of the instructions.   The patient was advised to call back or seek an in-person evaluation if the symptoms worsen or if the condition fails to improve as anticipated.  Visit Diagnosis: 1. Iron deficiency anemia, unspecified iron deficiency anemia type     Dr. Randa Evens, MD, MPH [facility] at [facility] Tel- [phone] 06/05/2019 12:58 PM

## 2019-08-13 ENCOUNTER — Telehealth: Payer: Self-pay | Admitting: Primary Care

## 2019-08-13 NOTE — Telephone Encounter (Signed)
Patient stated that she is needing to renew her FMLA  She wanted to know if an appointment will be required or if this is something that could be done without an appointment

## 2019-08-13 NOTE — Telephone Encounter (Signed)
She will need an appointment. We can meet virtually if that's more convenient.  Please schedule.

## 2019-08-14 ENCOUNTER — Telehealth (INDEPENDENT_AMBULATORY_CARE_PROVIDER_SITE_OTHER): Payer: 59 | Admitting: Primary Care

## 2019-08-14 DIAGNOSIS — F418 Other specified anxiety disorders: Secondary | ICD-10-CM

## 2019-08-14 NOTE — Progress Notes (Signed)
Subjective:    Patient ID: Maria [name], female    DOB: 02-07-1976, 44 y.o.   MRN: [id number]  HPI  Virtual Visit via Video Note  I connected with Maria [name] on 08/14/19 at 11:40 AM EDT by a video enabled telemedicine application and verified that I am speaking with the correct person using two identifiers.  Location: Patient: Home Provider: Office   I discussed the limitations of evaluation and management by telemedicine and the availability of in person appointments. The patient expressed understanding and agreed to proceed.  History of Present Illness:  Maria [name] is a 44 year old female with a history of situational anxiety who presents today to discuss FMLA.  Previously managed on FMLA for extension of breaks during her work week due to symptoms of stress which lead to nightmares, headaches, anxiety. We agreed to intermittent breaks 1-2 hours per shift which was originally initiated in May 2020. During her follow up visit in December 2020 she endorsed doing very well, improved symptoms, and felt that she could do her job effectively.   Her FMLA lapsed in late May 2021, but she purposefully allowed this to occur as she wanted to see how she did without having her allotted breaks. When her FMLA was active, she endorsed hardly every utilizing it, but felt better knowing she had the option.    Since allowing her FMLA to lapse, she's noticed increased anxiety, feeling the stressors of her job, inability to work effectively. She would like to resume her FMLA for another six months as she would like the opportunity to "step away and re-group" whe needed. Currently she has two 10 minute breaks and a 30 minute lunch break. She has her children at home for the Summer, which has been challenging while working from home. She is working 8 to 10 hours daily, six days weekly, about 48-50 hours weekly.  Observations/Objective:  Alert and oriented. Appears well, not  sickly. No distress. Speaking in complete sentences.   Assessment and Plan:  Anxiety improved with having the option of intermittent FMLA allowing breaks if needed. Agree to resume with our same agreement. Will renew and extend for 6 additional months. I did request that we follow back up at that time for re-evaluation. She agreed.  Will work on paper work. FMLA to start June 27th and extend through December 31st.  Follow Up Instructions:  We will notify you if we need updated FMLA paperwork.  It was a pleasure to see you today! Allie Bossier, NP-C     I discussed the assessment and treatment plan with the patient. The patient was provided an opportunity to ask questions and all were answered. The patient agreed with the plan and demonstrated an understanding of the instructions.   The patient was advised to call back or seek an in-person evaluation if the symptoms worsen or if the condition fails to improve as anticipated.    Pleas Koch, NP    Review of Systems  Respiratory: Negative for shortness of breath.   Cardiovascular: Negative for chest pain.  Neurological: Positive for headaches.  Psychiatric/Behavioral: Positive for sleep disturbance. The patient is nervous/anxious.        See HPI       Past Medical History:  Diagnosis Date   History of UTI    Hypertension      Social History   Socioeconomic History   Marital status: Married    Spouse name: Not on file   Number  of children: Not on file   Years of education: Not on file   Highest education level: Not on file  Occupational History   Not on file  Tobacco Use   Smoking status: Never Smoker   Smokeless tobacco: Never Used  Vaping Use   Vaping Use: Never used  Substance and Sexual Activity   Alcohol use: No   Drug use: No   Sexual activity: Yes    Birth control/protection: None  Other Topics Concern   Not on file  Social History Narrative   Married.   2 children.   Works  as an Passenger transport manager.   Enjoys relaxing.    Social Determinants of Health   Financial Resource Strain:    Difficulty of Paying Living Expenses:   Food Insecurity:    Worried About Charity fundraiser in the Last Year:    Arboriculturist in the Last Year:   Transportation Needs:    Film/video editor (Medical):    Lack of Transportation (Non-Medical):   Physical Activity:    Days of Exercise per Week:    Minutes of Exercise per Session:   Stress:    Feeling of Stress :   Social Connections:    Frequency of Communication with Friends and Family:    Frequency of Social Gatherings with Friends and Family:    Attends Religious Services:    Active Member of Clubs or Organizations:    Attends Music therapist:    Marital Status:   Intimate Partner Violence:    Fear of Current or Ex-Partner:    Emotionally Abused:    Physically Abused:    Sexually Abused:     Past Surgical History:  Procedure Laterality Date   EYE SURGERY      Family History  Problem Relation Age of Onset   Arthritis Maternal Grandmother    Diabetes Maternal Grandmother    Hypertension Maternal Grandmother    Throat cancer Maternal Grandfather    Birth defects Maternal Grandfather    Breast cancer Neg Hx     No Known Allergies  Current Outpatient Medications on File Prior to Visit  Medication Sig Dispense Refill   Cholecalciferol (VITAMIN D3) 1.25 MG (50000 UT) CAPS Take 1 capsule by mouth daily.     ergocalciferol (VITAMIN D2) 1.25 MG (50000 UT) capsule Take 1 capsule by mouth once a week.     metaxalone (SKELAXIN) 800 MG tablet Take 800 mg by mouth every 8 (eight) hours as needed.     No current facility-administered medications on file prior to visit.    There were no vitals taken for this visit.   Objective:   Physical Exam Pulmonary:     Effort: Pulmonary effort is normal.  Neurological:     Mental Status: She is alert and oriented to person,  place, and time.  Psychiatric:        Mood and Affect: Mood normal.            Assessment & Plan:

## 2019-08-14 NOTE — Patient Instructions (Signed)
We will notify you if we need updated FMLA paperwork.  It was a pleasure to see you today! Allie Bossier, NP-C

## 2019-08-14 NOTE — Telephone Encounter (Signed)
lvm asking pt to call office. She had virtual appt with Anda Kraft today. She just needs to request a new copy of FMLA ppw from [place] for Anda Kraft to fill out.

## 2019-08-14 NOTE — Assessment & Plan Note (Signed)
Anxiety improved with having the option of intermittent FMLA allowing breaks if needed. Agree to resume with our same agreement. Will renew and extend for 6 additional months. I did request that we follow back up at that time for re-evaluation. She agreed.  Will work on paper work. FMLA to start June 27th and extend through December 31st.

## 2019-08-17 ENCOUNTER — Telehealth: Payer: Self-pay

## 2019-08-28 NOTE — Telephone Encounter (Signed)
Maria [name], I completed her FMLA paper work very soon after that date. Can you check on what's going on? Raquel Sarna was supposed to fax.

## 2019-08-28 NOTE — Telephone Encounter (Signed)
Raquel Sarna do you have this.  I dont see it up front for patient pickup or scanned inchart

## 2019-08-28 NOTE — Telephone Encounter (Signed)
Please advise 

## 2019-08-28 NOTE — Telephone Encounter (Signed)
Pt called checking on her FMLA paperwork she stated she dropped this off 7/2. The fmla paperwork is needing to be faxedin or they willclose out her claim   Best number [phone]

## 2019-09-11 NOTE — Telephone Encounter (Signed)
Made in error

## 2019-10-01 ENCOUNTER — Inpatient Hospital Stay: Payer: 59

## 2019-10-05 ENCOUNTER — Other Ambulatory Visit: Payer: Self-pay

## 2019-10-05 DIAGNOSIS — D509 Iron deficiency anemia, unspecified: Secondary | ICD-10-CM

## 2019-10-08 ENCOUNTER — Inpatient Hospital Stay: Payer: 59 | Attending: Oncology

## 2019-10-17 HISTORY — PX: EYE SURGERY: SHX253

## 2019-11-28 ENCOUNTER — Telehealth: Payer: Self-pay | Admitting: General Practice

## 2020-01-01 LAB — RESULTS CONSOLE HPV: CHL HPV: NEGATIVE

## 2020-01-01 LAB — HM PAP SMEAR

## 2020-01-07 ENCOUNTER — Other Ambulatory Visit: Payer: Self-pay

## 2020-01-07 ENCOUNTER — Encounter: Payer: Self-pay | Admitting: Gastroenterology

## 2020-01-07 ENCOUNTER — Ambulatory Visit (INDEPENDENT_AMBULATORY_CARE_PROVIDER_SITE_OTHER): Payer: 59 | Admitting: Gastroenterology

## 2020-01-07 VITALS — BP 165/95 | HR 80 | Temp 98.1°F | Ht 68.0 in | Wt 190.0 lb

## 2020-01-07 DIAGNOSIS — D509 Iron deficiency anemia, unspecified: Secondary | ICD-10-CM | POA: Diagnosis not present

## 2020-01-07 DIAGNOSIS — Z1211 Encounter for screening for malignant neoplasm of colon: Secondary | ICD-10-CM

## 2020-01-07 MED ORDER — NA SULFATE-K SULFATE-MG SULF 17.5-3.13-1.6 GM/177ML PO SOLN: ORAL | 0 refills | Status: DC

## 2020-01-08 ENCOUNTER — Ambulatory Visit: Payer: 59 | Admitting: Gastroenterology

## 2020-01-08 NOTE — Progress Notes (Signed)
Maria [name] [address]  [place]  [place], [place] [address]  Main: [phone]  Fax: [phone]   Gastroenterology Consultation  Referring Provider:     Sindy Guadeloupe, MD Primary Care Physician:  Pleas Koch, NP Reason for Consultation:     Iron deficiency anemia        HPI:    Chief Complaint  Patient presents with  . Anemia    Maria [name] is a 44 y.o. y/o female referred for consultation & management  by Dr. Carlis Abbott, Leticia Penna, NP.  Patient found to have iron deficiency anemia.  Previously had heavy periods but that is not the case anymore.  Denies any other sources of bleeding. The patient denies abdominal or flank pain, anorexia, nausea or vomiting, dysphagia, change in bowel habits or black or bloody stools or weight loss.  No prior EGD or colonoscopy.  No family history of colon cancer.  Past Medical History:  Diagnosis Date  . History of UTI   . Hypertension     Past Surgical History:  Procedure Laterality Date  . EYE SURGERY      Prior to Admission medications   Medication Sig Start Date End Date Taking? Authorizing Provider  Cholecalciferol (VITAMIN D3) 1.25 MG (50000 UT) CAPS Take 1 capsule by mouth daily. 04/12/19  Yes [provider]  ergocalciferol (VITAMIN D2) 1.25 MG (50000 UT) capsule Take 1 capsule by mouth once a week. 04/12/19  Yes [provider]  Na Sulfate-K Sulfate-Mg Sulf 17.5-3.13-1.6 GM/177ML SOLN At 5 PM the day before procedure take 1 bottle and 5 hours before procedure take 1 bottle. 01/07/20   Virgel Manifold, MD    Family History  Problem Relation Age of Onset  . Arthritis Maternal Grandmother   . Diabetes Maternal Grandmother   . Hypertension Maternal Grandmother   . Throat cancer Maternal Grandfather   . Birth defects Maternal Grandfather   . Breast cancer Neg Hx      Social History   Tobacco Use  . Smoking status: Never Smoker  . Smokeless tobacco: Never Used  Vaping  Use  . Vaping Use: Never used  Substance Use Topics  . Alcohol use: No  . Drug use: No    Allergies as of 01/07/2020  . (No Known Allergies)    Review of Systems:    All systems reviewed and negative except where noted in HPI.   Physical Exam:  BP (!) 165/95   Pulse 80   Temp 98.1 F (36.7 C) (Oral)   Ht 5\' 8"  (1.727 m)   Wt 190 lb (86.2 kg)   BMI 28.89 kg/m  No LMP recorded. (Menstrual status: Perimenopausal). Psych:  Alert and cooperative. Normal mood and affect. General:   Alert,  Well-developed, well-nourished, pleasant and cooperative in NAD Head:  Normocephalic and atraumatic. Eyes:  Sclera clear, no icterus.   Conjunctiva pink. Ears:  Normal auditory acuity. Nose:  No deformity, discharge, or lesions. Mouth:  No deformity or lesions,oropharynx pink & moist. Neck:  Supple; no masses or thyromegaly. Abdomen:  Normal bowel sounds.  No bruits.  Soft, non-tender and non-distended without masses, hepatosplenomegaly or hernias noted.  No guarding or rebound tenderness.    Msk:  Symmetrical without gross deformities. Good, equal movement & strength bilaterally. Pulses:  Normal pulses noted. Extremities:  No clubbing or edema.  No cyanosis. Neurologic:  Alert and oriented x3;  grossly normal neurologically. Skin:  Intact without significant lesions or rashes. No  jaundice. Lymph Nodes:  No significant cervical adenopathy. Psych:  Alert and cooperative. Normal mood and affect.   Labs: CBC    Component Value Date/Time   WBC 4.7 05/31/2019 1154   RBC 4.33 05/31/2019 1154   HGB 12.3 05/31/2019 1154   HGB 9.3 (L) 12/13/2016 1156   HCT 37.6 05/31/2019 1154   HCT 31.9 (L) 12/13/2016 1156   PLT 165 05/31/2019 1154   PLT 284 12/13/2016 1156   MCV 86.8 05/31/2019 1154   MCV 84 12/13/2016 1156   MCH 28.4 05/31/2019 1154   MCHC 32.7 05/31/2019 1154   RDW 19.4 (H) 05/31/2019 1154   RDW 18.0 (H) 12/13/2016 1156   LYMPHSABS 1.3 05/31/2019 1154   MONOABS 0.6 05/31/2019 1154     EOSABS 0.1 05/31/2019 1154   BASOSABS 0.0 05/31/2019 1154   CMP     Component Value Date/Time   NA 136 07/22/2016 2248   K 3.8 07/22/2016 2248   CL 105 07/22/2016 2248   CO2 27 07/22/2016 2248   GLUCOSE 99 07/22/2016 2248   BUN 9 07/22/2016 2248   CREATININE 0.98 07/22/2016 2248   CALCIUM 9.0 07/22/2016 2248   GFRNONAA >60 07/22/2016 2248   GFRAA >60 07/22/2016 2248    Imaging Studies: No results found.  Assessment and Plan:   Maria [name] is a 44 y.o. y/o female has been referred for iron deficiency anemia  EGD and colonoscopy indicated for the above given no other etiology of her iron deficiency  Colonoscopy also indicated for screening  I have discussed alternative options, risks & benefits,  which include, but are not limited to, bleeding, infection, perforation,respiratory complication & drug reaction.  The patient agrees with this plan & written consent will be obtained.       Dr Maria [name]  Speech recognition software was used to dictate the above note.

## 2020-01-15 ENCOUNTER — Telehealth: Payer: Self-pay | Admitting: *Deleted

## 2020-01-15 ENCOUNTER — Telehealth: Payer: Self-pay | Admitting: Gastroenterology

## 2020-01-15 NOTE — Telephone Encounter (Signed)
Patient states she needs to cancel her procedure for 12.27.21 due to having cataract surgery that same day. Pt states she will cb to resch after first of the year. Pt had ov on 11.22.21

## 2020-01-15 NOTE — Telephone Encounter (Signed)
Patient called stating that her blood pressure has been running high. Patient stated that she has some procedures coming up and needs to get her blood pressure down. Patient stated that she saw her plastic surgeon today and her blood pressure was 181/105 and after a few minutes it came down to 176/84. Patient stated that they used a machine and it was not checked manually. Patient stated that she is under a lot of stress and did not sleep good last night. Patient denies a headache or any other symptoms. Patient was given ER precautions and she verbalized understanding. Patient scheduled to see Allie Bossier NP tomorrow 01/16/20 at noon. Patient had a negative covid screening

## 2020-01-15 NOTE — Telephone Encounter (Signed)
Noted, will evaluate. 

## 2020-01-15 NOTE — Telephone Encounter (Signed)
Called endo unit and spoke with [facility] and cancelled with her.

## 2020-01-16 ENCOUNTER — Other Ambulatory Visit: Payer: Self-pay

## 2020-01-16 ENCOUNTER — Ambulatory Visit (INDEPENDENT_AMBULATORY_CARE_PROVIDER_SITE_OTHER): Payer: 59 | Admitting: Primary Care

## 2020-01-16 ENCOUNTER — Telehealth: Payer: Self-pay | Admitting: *Deleted

## 2020-01-16 ENCOUNTER — Encounter: Payer: Self-pay | Admitting: Primary Care

## 2020-01-16 ENCOUNTER — Other Ambulatory Visit: Payer: Self-pay | Admitting: Primary Care

## 2020-01-16 VITALS — BP 158/100 | HR 78 | Temp 97.8°F | Ht 68.0 in | Wt 191.0 lb

## 2020-01-16 DIAGNOSIS — I10 Essential (primary) hypertension: Secondary | ICD-10-CM | POA: Diagnosis not present

## 2020-01-16 DIAGNOSIS — F418 Other specified anxiety disorders: Secondary | ICD-10-CM

## 2020-01-16 MED ORDER — AMLODIPINE BESYLATE 5 MG PO TABS: 5.0000 mg | ORAL_TABLET | Freq: Every day | ORAL | 0 refills | Status: DC

## 2020-01-16 NOTE — Assessment & Plan Note (Signed)
Chronic and continued, extra breaks during her work day have been very beneficial.   Agree to extend for another 6 months. Will update FMLA paperwork.

## 2020-01-16 NOTE — Assessment & Plan Note (Signed)
Numerous documented elevated blood pressure readings. Discussed treatment for hypertension today. She endorses already eating a healthy diet and completes regular exercise.   Given chronic elevated readings we will initiate prescription treatment. Rx for amlodipine 5 mg sent to pharmacy.   Labs from care everywhere reviewed that were collected in November 2021.  We will have her monitor BP at home and follow up with [place] in 2 weeks.

## 2020-01-16 NOTE — Patient Instructions (Signed)
Start amlodipine 5 mg once daily for blood pressure.  Start monitoring your blood pressure daily, around the same time of day, for the next 2 weeks.  Ensure that you have rested for 30 minutes prior to checking your blood pressure. Record your readings and bring them to your next visit.  Please schedule a follow up visit to meet back with me in 2 weeks for blood pressure check.   It was a pleasure to see you today!   DASH Eating Plan DASH stands for "Dietary Approaches to Stop Hypertension." The DASH eating plan is a healthy eating plan that has been shown to reduce high blood pressure (hypertension). It may also reduce your risk for type 2 diabetes, heart disease, and stroke. The DASH eating plan may also help with weight loss. What are tips for following this plan?  General guidelines  Avoid eating more than 2,300 mg (milligrams) of salt (sodium) a day. If you have hypertension, you may need to reduce your sodium intake to 1,500 mg a day.  Limit alcohol intake to no more than 1 drink a day for nonpregnant women and 2 drinks a day for men. One drink equals 12 oz of beer, 5 oz of wine, or 1 oz of hard liquor.  Work with your health care provider to maintain a healthy body weight or to lose weight. Ask what an ideal weight is for you.  Get at least 30 minutes of exercise that causes your heart to beat faster (aerobic exercise) most days of the week. Activities may include walking, swimming, or biking.  Work with your health care provider or diet and nutrition specialist (dietitian) to adjust your eating plan to your individual calorie needs. Reading food labels   Check food labels for the amount of sodium per serving. Choose foods with less than 5 percent of the Daily Value of sodium. Generally, foods with less than 300 mg of sodium per serving fit into this eating plan.  To find whole grains, look for the word "whole" as the first word in the ingredient list. Shopping  Buy products  labeled as "low-sodium" or "no salt added."  Buy fresh foods. Avoid canned foods and premade or frozen meals. Cooking  Avoid adding salt when cooking. Use salt-free seasonings or herbs instead of table salt or sea salt. Check with your health care provider or pharmacist before using salt substitutes.  Do not fry foods. Cook foods using healthy methods such as baking, boiling, grilling, and broiling instead.  Cook with heart-healthy oils, such as olive, canola, soybean, or sunflower oil. Meal planning  Eat a balanced diet that includes: ? 5 or more servings of fruits and vegetables each day. At each meal, try to fill half of your plate with fruits and vegetables. ? Up to 6-8 servings of whole grains each day. ? Less than 6 oz of lean meat, poultry, or fish each day. A 3-oz serving of meat is about the same size as a deck of cards. One egg equals 1 oz. ? 2 servings of low-fat dairy each day. ? A serving of nuts, seeds, or beans 5 times each week. ? Heart-healthy fats. Healthy fats called Omega-3 fatty acids are found in foods such as flaxseeds and coldwater fish, like sardines, salmon, and mackerel.  Limit how much you eat of the following: ? Canned or prepackaged foods. ? Food that is high in trans fat, such as fried foods. ? Food that is high in saturated fat, such as fatty meat. ?  Sweets, desserts, sugary drinks, and other foods with added sugar. ? Full-fat dairy products.  Do not salt foods before eating.  Try to eat at least 2 vegetarian meals each week.  Eat more home-cooked food and less restaurant, buffet, and fast food.  When eating at a restaurant, ask that your food be prepared with less salt or no salt, if possible. What foods are recommended? The items listed may not be a complete list. Talk with your dietitian about what dietary choices are best for you. Grains Whole-grain or whole-wheat bread. Whole-grain or whole-wheat pasta. Brown rice. [place]. Bulgur.  Whole-grain and low-sodium cereals. Pita bread. Low-fat, low-sodium crackers. Whole-wheat flour tortillas. Vegetables Fresh or frozen vegetables (raw, steamed, roasted, or grilled). Low-sodium or reduced-sodium tomato and vegetable juice. Low-sodium or reduced-sodium tomato sauce and tomato paste. Low-sodium or reduced-sodium canned vegetables. Fruits All fresh, dried, or frozen fruit. Canned fruit in natural juice (without added sugar). Meat and other protein foods Skinless chicken or [place]. Ground chicken or [place]. Pork with fat trimmed off. Fish and seafood. Egg whites. Dried beans, peas, or lentils. Unsalted nuts, nut butters, and seeds. Unsalted canned beans. Lean cuts of beef with fat trimmed off. Low-sodium, lean deli meat. Dairy Low-fat (1%) or fat-free (skim) milk. Fat-free, low-fat, or reduced-fat cheeses. Nonfat, low-sodium ricotta or cottage cheese. Low-fat or nonfat yogurt. Low-fat, low-sodium cheese. Fats and oils Soft margarine without trans fats. Vegetable oil. Low-fat, reduced-fat, or light mayonnaise and salad dressings (reduced-sodium). Canola, safflower, olive, soybean, and sunflower oils. Avocado. Seasoning and other foods Herbs. Spices. Seasoning mixes without salt. Unsalted popcorn and pretzels. Fat-free sweets. What foods are not recommended? The items listed may not be a complete list. Talk with your dietitian about what dietary choices are best for you. Grains Baked goods made with fat, such as croissants, muffins, or some breads. Dry pasta or rice meal packs. Vegetables Creamed or fried vegetables. Vegetables in a cheese sauce. Regular canned vegetables (not low-sodium or reduced-sodium). Regular canned tomato sauce and paste (not low-sodium or reduced-sodium). Regular tomato and vegetable juice (not low-sodium or reduced-sodium). Angie Fava. Olives. Fruits Canned fruit in a light or heavy syrup. Fried fruit. Fruit in cream or butter sauce. Meat and other protein  foods Fatty cuts of meat. Ribs. Fried meat. Berniece Salines. Sausage. Bologna and other processed lunch meats. Salami. Fatback. Hotdogs. Bratwurst. Salted nuts and seeds. Canned beans with added salt. Canned or smoked fish. Whole eggs or egg yolks. Chicken or [place] with skin. Dairy Whole or 2% milk, cream, and half-and-half. Whole or full-fat cream cheese. Whole-fat or sweetened yogurt. Full-fat cheese. Nondairy creamers. Whipped toppings. Processed cheese and cheese spreads. Fats and oils Butter. Stick margarine. Lard. Shortening. Ghee. Bacon fat. Tropical oils, such as coconut, palm kernel, or palm oil. Seasoning and other foods Salted popcorn and pretzels. Onion salt, garlic salt, seasoned salt, table salt, and sea salt. Worcestershire sauce. Tartar sauce. Barbecue sauce. Teriyaki sauce. Soy sauce, including reduced-sodium. Steak sauce. Canned and packaged gravies. Fish sauce. Oyster sauce. Cocktail sauce. Horseradish that you find on the shelf. Ketchup. Mustard. Meat flavorings and tenderizers. Bouillon cubes. Hot sauce and Tabasco sauce. Premade or packaged marinades. Premade or packaged taco seasonings. Relishes. Regular salad dressings. Where to find more information:  National Heart, Lung, and [place]: https://wilson-eaton.com/  American Heart Association: www.heart.org Summary  The DASH eating plan is a healthy eating plan that has been shown to reduce high blood pressure (hypertension). It may also reduce your risk for type 2 diabetes,  heart disease, and stroke.  With the DASH eating plan, you should limit salt (sodium) intake to 2,300 mg a day. If you have hypertension, you may need to reduce your sodium intake to 1,500 mg a day.  When on the DASH eating plan, aim to eat more fresh fruits and vegetables, whole grains, lean proteins, low-fat dairy, and heart-healthy fats.  Work with your health care provider or diet and nutrition specialist (dietitian) to adjust your eating plan to your  individual calorie needs. This information is not intended to replace advice given to you by your health care provider. Make sure you discuss any questions you have with your health care provider. Document Revised: 01/14/2017 Document Reviewed: 01/26/2016 Elsevier Patient Education  2020 [employer].

## 2020-01-16 NOTE — Telephone Encounter (Signed)
Manele and spoke to Maudie Mercury to let her know that patient called and cancelled her procedure since she was having cataract surgery on that same day. Patient will call us back to reschedule.

## 2020-01-16 NOTE — Progress Notes (Signed)
Subjective:    Patient ID: Maria [name], female    DOB: 13-May-1975, 44 y.o.   MRN: [id number]  HPI  This visit occurred during the SARS-CoV-2 public health emergency.  Safety protocols were in place, including screening questions prior to the visit, additional usage of staff PPE, and extensive cleaning of exam room while observing appropriate contact time as indicated for disinfecting solutions.   Maria [name] is a 44 year old female with a history of anemia, abnormal TSH, dizziness, situational anxiety who presents today for follow up and a chief complaint of hypertension.  1) Elevated Blood Pressure: No formal diagnosis of hypertension, never treated. Denies family history of hypertension in parents, headaches, dizziness, chest pain.   She endorses a healthy diet, is exercising regularly. She does have chronic fatigue, also a lot of stress with work.   She will be undergoing a cosmetic procedure, liposuction, on December 16th in [place].   BP Readings from Last 3 Encounters:  01/16/20 (!) 158/100  01/07/20 (!) 165/95  03/30/19 (!) 159/100   2) Situational Anxiety: Chronic, mostly work related. Currently on FMLA for extension of breaks during her work week. She experiences a lot of stress with work responsibilities which cause symptoms of headaches, anxiety, sleep disturbance.  Her current FMLA expires on December 31st, she would like another 6 month extension to take additional breaks when needed. She does not abuse her breaks and doesn't use them everyday.   Review of Systems  Constitutional: Positive for fatigue.  Respiratory: Negative for shortness of breath.   Cardiovascular: Negative for chest pain.  Neurological: Negative for dizziness and headaches.  Psychiatric/Behavioral: The patient is nervous/anxious.        See HPI       Past Medical History:  Diagnosis Date  . History of UTI   . Hypertension      Social History   Socioeconomic History  .  Marital status: Married    Spouse name: Not on file  . Number of children: Not on file  . Years of education: Not on file  . Highest education level: Not on file  Occupational History  . Not on file  Tobacco Use  . Smoking status: Never Smoker  . Smokeless tobacco: Never Used  Vaping Use  . Vaping Use: Never used  Substance and Sexual Activity  . Alcohol use: No  . Drug use: No  . Sexual activity: Yes    Birth control/protection: None  Other Topics Concern  . Not on file  Social History Narrative   Married.   2 children.   Works as an Passenger transport manager.   Enjoys relaxing.    Social Determinants of Health   Financial Resource Strain:   . Difficulty of Paying Living Expenses: Not on file  Food Insecurity:   . Worried About Charity fundraiser in the Last Year: Not on file  . Ran Out of Food in the Last Year: Not on file  Transportation Needs:   . Lack of Transportation (Medical): Not on file  . Lack of Transportation (Non-Medical): Not on file  Physical Activity:   . Days of Exercise per Week: Not on file  . Minutes of Exercise per Session: Not on file  Stress:   . Feeling of Stress : Not on file  Social Connections:   . Frequency of Communication with Friends and Family: Not on file  . Frequency of Social Gatherings with Friends and Family: Not on file  . Attends Religious  Services: Not on file  . Active Member of Clubs or Organizations: Not on file  . Attends Archivist Meetings: Not on file  . Marital Status: Not on file  Intimate Partner Violence:   . Fear of Current or Ex-Partner: Not on file  . Emotionally Abused: Not on file  . Physically Abused: Not on file  . Sexually Abused: Not on file    Past Surgical History:  Procedure Laterality Date  . EYE SURGERY  10/2019    Family History  Problem Relation Age of Onset  . Arthritis Maternal Grandmother   . Diabetes Maternal Grandmother   . Hypertension Maternal Grandmother   . Throat cancer  Maternal Grandfather   . Birth defects Maternal Grandfather   . Breast cancer Neg Hx     No Known Allergies  Current Outpatient Medications on File Prior to Visit  Medication Sig Dispense Refill  . ergocalciferol (VITAMIN D2) 1.25 MG (50000 UT) capsule Take 1 capsule by mouth once a week.    . Cholecalciferol (VITAMIN D3) 1.25 MG (50000 UT) CAPS Take 1 capsule by mouth daily. (Patient not taking: Reported on 01/16/2020)    . Na Sulfate-K Sulfate-Mg Sulf 17.5-3.13-1.6 GM/177ML SOLN At 5 PM the day before procedure take 1 bottle and 5 hours before procedure take 1 bottle. (Patient not taking: Reported on 01/16/2020) 354 mL 0   No current facility-administered medications on file prior to visit.    BP (!) 158/100   Pulse 78   Temp 97.8 F (36.6 C) (Temporal)   Ht 5\' 8"  (1.727 m)   Wt 191 lb (86.6 kg)   LMP 12/01/2019 (Approximate)   SpO2 100%   BMI 29.04 kg/m    Objective:   Physical Exam Cardiovascular:     Rate and Rhythm: Normal rate and regular rhythm.  Pulmonary:     Effort: Pulmonary effort is normal.     Breath sounds: Normal breath sounds.  Musculoskeletal:     Cervical back: Neck supple.  Skin:    General: Skin is warm and dry.            Assessment & Plan:

## 2020-01-16 NOTE — Telephone Encounter (Signed)
Amlodipine should have been available, make sure she got this. This is for BP. She made no mention of difficulty sleeping during her visit. Would recommend she try melatonin 5-10 mg first. If no improvement then can try Unisom.   I am happy to meet with her virtually to discuss sleep if she'd prefer.

## 2020-01-16 NOTE — Telephone Encounter (Signed)
Patient left a voicemail stating that she got a message from the pharmacy that the medication that Allie Bossier NP sent in for her today is not an approved medication.  Patient also requested that Anda Kraft give her Ambien to last her until she comes back to see her. Pharmacy . [facility]  Called patient back and was advised to disregard the message about the medication not approved because she was able to pick it up. Patient stated that she still would like Ambien if Anda Kraft will prescribe it for her.

## 2020-01-17 NOTE — Telephone Encounter (Signed)
Left message to return call to our office.  

## 2020-01-18 ENCOUNTER — Other Ambulatory Visit: Payer: Self-pay | Admitting: Primary Care

## 2020-01-18 NOTE — Telephone Encounter (Signed)
Noted  

## 2020-01-18 NOTE — Telephone Encounter (Signed)
Called patient she has tried otc medications with no help. She wanted to make virtual today she is flying out this afternoon and wanted to something before she left. Let patient know first available is Monday. She declined will talk about it at follow up later in the month. Will give our office a call if she would like to be seen sooner.

## 2020-01-28 ENCOUNTER — Other Ambulatory Visit: Payer: 59

## 2020-01-28 ENCOUNTER — Ambulatory Visit (INDEPENDENT_AMBULATORY_CARE_PROVIDER_SITE_OTHER): Payer: 59 | Admitting: Primary Care

## 2020-01-28 ENCOUNTER — Encounter: Payer: Self-pay | Admitting: Primary Care

## 2020-01-28 ENCOUNTER — Other Ambulatory Visit: Payer: Self-pay

## 2020-01-28 VITALS — BP 136/78 | HR 96 | Temp 97.6°F | Ht 68.0 in | Wt 198.0 lb

## 2020-01-28 DIAGNOSIS — I10 Essential (primary) hypertension: Secondary | ICD-10-CM | POA: Diagnosis not present

## 2020-01-28 NOTE — Assessment & Plan Note (Signed)
Improved today, but has been off medication for the last 2 days due to side effects.  Discussed that we can switch medication to HCTZ that may not cause side effects, she kindly declines.  Labs reviewed from GYN in November 2021, all appear grossly unremarkable. ECG today with NSR with rate of 89. No PAC/PVC, St changes. Appears similar to ECG from 2018.  She plans on resuming amlodipine, but will notify if symptoms return and are intolerable, at that point we can consider switching to HCTZ. She will update.

## 2020-01-28 NOTE — Progress Notes (Signed)
Subjective:    Patient ID: Maria [name], female    DOB: 09-13-1975, 44 y.o.   MRN: [id number]  HPI  This visit occurred during the SARS-CoV-2 public health emergency.  Safety protocols were in place, including screening questions prior to the visit, additional usage of staff PPE, and extensive cleaning of exam room while observing appropriate contact time as indicated for disinfecting solutions.   Maria [name] is a 44 year old female with a history of hypertension, anemia, abnormal TSH who presents today for follow up of hypertension.   Currently managed on amlodipine 5 mg which was initiated on 01/16/20 for treatment of elevated blood pressure readings. She is pending cosmetic surgery scheduled for 01/31/20 in [place]. No prior history of hypertension. She was asked to follow up to day for evaluation.   She's not had her amlodipine in two days due to symptoms of palpations that occurred at night, these symptoms have improved since she's been off amlodipine. She denies headaches, dizziness. She checked her BP once since her last visit, meter read 353 systolic, then error so she stopped.   She underwent lab work in mid November 2021 per GYN, TSH of 0.77, creatinine of 1.0, CBC unremarkable.   BP Readings from Last 3 Encounters:  01/28/20 136/78  01/16/20 (!) 158/100  01/07/20 (!) 165/95     Review of Systems  Constitutional: Negative for fatigue.  Eyes: Negative for visual disturbance.  Respiratory: Negative for shortness of breath.   Cardiovascular: Positive for palpitations. Negative for chest pain.  Neurological: Negative for dizziness and headaches.       Past Medical History:  Diagnosis Date  . History of UTI   . Hypertension      Social History   Socioeconomic History  . Marital status: Married    Spouse name: Not on file  . Number of children: Not on file  . Years of education: Not on file  . Highest education level: Not on file  Occupational History   . Not on file  Tobacco Use  . Smoking status: Never Smoker  . Smokeless tobacco: Never Used  Vaping Use  . Vaping Use: Never used  Substance and Sexual Activity  . Alcohol use: No  . Drug use: No  . Sexual activity: Yes    Birth control/protection: None  Other Topics Concern  . Not on file  Social History Narrative   Married.   2 children.   Works as an Passenger transport manager.   Enjoys relaxing.    Social Determinants of Health   Financial Resource Strain: Not on file  Food Insecurity: Not on file  Transportation Needs: Not on file  Physical Activity: Not on file  Stress: Not on file  Social Connections: Not on file  Intimate Partner Violence: Not on file    Past Surgical History:  Procedure Laterality Date  . EYE SURGERY  10/2019    Family History  Problem Relation Age of Onset  . Arthritis Maternal Grandmother   . Diabetes Maternal Grandmother   . Hypertension Maternal Grandmother   . Throat cancer Maternal Grandfather   . Birth defects Maternal Grandfather   . Breast cancer Neg Hx     No Known Allergies  Current Outpatient Medications on File Prior to Visit  Medication Sig Dispense Refill  . amLODipine (NORVASC) 5 MG tablet Take 1 tablet (5 mg total) by mouth daily. For blood pressure. 30 tablet 0  . ergocalciferol (VITAMIN D2) 1.25 MG (50000 UT) capsule Take 1  capsule by mouth once a week.     No current facility-administered medications on file prior to visit.    BP 136/78   Pulse 96   Temp 97.6 F (36.4 C) (Temporal)   Ht 5\' 8"  (1.727 m)   Wt 198 lb (89.8 kg)   SpO2 97%   BMI 30.11 kg/m    Objective:   Physical Exam Constitutional:      Appearance: She is well-nourished.  Cardiovascular:     Rate and Rhythm: Normal rate and regular rhythm.  Pulmonary:     Effort: Pulmonary effort is normal.     Breath sounds: Normal breath sounds.  Musculoskeletal:     Cervical back: Neck supple.  Skin:    General: Skin is warm and dry.  Psychiatric:         Mood and Affect: Mood and affect and mood normal.            Assessment & Plan:

## 2020-01-28 NOTE — Patient Instructions (Addendum)
Resume amlodipine for blood pressure.   Please notify me if your symptoms return and are intolerable.  It was a pleasure to see you today!

## 2020-01-29 ENCOUNTER — Other Ambulatory Visit: Payer: 59

## 2020-01-30 NOTE — Telephone Encounter (Signed)
open encounter in error 

## 2020-01-31 ENCOUNTER — Inpatient Hospital Stay: Payer: 59

## 2020-02-01 ENCOUNTER — Inpatient Hospital Stay: Payer: 59 | Admitting: Oncology

## 2020-02-04 ENCOUNTER — Encounter: Payer: 59 | Admitting: Primary Care

## 2020-02-04 DIAGNOSIS — Z0289 Encounter for other administrative examinations: Secondary | ICD-10-CM

## 2020-02-06 ENCOUNTER — Inpatient Hospital Stay: Payer: 59 | Attending: Oncology

## 2020-02-07 ENCOUNTER — Other Ambulatory Visit: Payer: Self-pay

## 2020-02-07 ENCOUNTER — Inpatient Hospital Stay: Payer: 59 | Admitting: Oncology

## 2020-02-11 ENCOUNTER — Ambulatory Visit: Admission: RE | Admit: 2020-02-11 | Payer: 59 | Source: Home / Self Care | Admitting: Gastroenterology

## 2020-02-11 ENCOUNTER — Encounter: Admission: RE | Payer: Self-pay | Source: Home / Self Care

## 2020-02-11 SURGERY — COLONOSCOPY WITH PROPOFOL
Anesthesia: Choice

## 2020-02-12 ENCOUNTER — Other Ambulatory Visit: Payer: Self-pay | Admitting: Primary Care

## 2020-02-12 DIAGNOSIS — I10 Essential (primary) hypertension: Secondary | ICD-10-CM

## 2020-02-18 ENCOUNTER — Telehealth: Payer: 59 | Admitting: Gastroenterology

## 2020-04-22 ENCOUNTER — Telehealth: Payer: Self-pay | Admitting: Primary Care

## 2020-04-22 DIAGNOSIS — I10 Essential (primary) hypertension: Secondary | ICD-10-CM

## 2020-04-22 MED ORDER — AMLODIPINE BESYLATE 5 MG PO TABS: ORAL_TABLET | ORAL | 0 refills | Status: DC

## 2020-04-22 NOTE — Telephone Encounter (Signed)
Will provide 3 month supply. Patient does need follow up visit in June 2022, will you please schedule this with her?

## 2020-04-22 NOTE — Telephone Encounter (Signed)
Patient called in stating she is needing her refill for the amLODipine (NORVASC) 5 MG tablet to go to [facility]. Please advise and call patient when it has been called in.

## 2020-04-22 NOTE — Telephone Encounter (Signed)
Patient called in stating she had received a bill for a $50 no show fee, when she specifically remembers coming in the week earlier to be seen instead. Asking to speak with someone in regards to this as she does not believe she should be charged, as she was seen the 13th. Please advise.

## 2020-04-22 NOTE — Telephone Encounter (Signed)
Please advise refill ok? Last given 02/12/2020 and given #30

## 2020-04-23 NOTE — Telephone Encounter (Signed)
Left message to return call to our office.  

## 2020-04-24 NOTE — Telephone Encounter (Signed)
Lvm asking pt to call office to discuss. If patient returns call, transfer to me.

## 2020-04-25 ENCOUNTER — Ambulatory Visit: Payer: 59 | Admitting: Primary Care

## 2020-04-28 NOTE — Telephone Encounter (Signed)
Pt returned call. I explained to her that I have sent this over to charge correction to be removed. She thanked me for taking care of this.

## 2020-04-28 NOTE — Telephone Encounter (Signed)
Attempted to reach patient again. Lvm asking her to call office.

## 2020-04-29 NOTE — Telephone Encounter (Signed)
Hey can you try to call patient for appointment

## 2020-05-01 NOTE — Telephone Encounter (Signed)
Scheduled for 07/25/20 at 2:20.

## 2020-06-16 ENCOUNTER — Other Ambulatory Visit: Payer: Self-pay | Admitting: Obstetrics & Gynecology

## 2020-06-16 DIAGNOSIS — Z1231 Encounter for screening mammogram for malignant neoplasm of breast: Secondary | ICD-10-CM

## 2020-06-28 ENCOUNTER — Other Ambulatory Visit: Payer: Self-pay | Admitting: Primary Care

## 2020-06-28 DIAGNOSIS — I10 Essential (primary) hypertension: Secondary | ICD-10-CM

## 2020-07-04 ENCOUNTER — Ambulatory Visit
Admission: RE | Admit: 2020-07-04 | Discharge: 2020-07-04 | Disposition: A | Discharge status: Home / Self Care | Payer: 59 | Source: Ambulatory Visit | Attending: Obstetrics & Gynecology | Admitting: Obstetrics & Gynecology

## 2020-07-04 ENCOUNTER — Other Ambulatory Visit: Payer: Self-pay

## 2020-07-04 DIAGNOSIS — Z1231 Encounter for screening mammogram for malignant neoplasm of breast: Secondary | ICD-10-CM | POA: Diagnosis not present

## 2020-07-25 ENCOUNTER — Ambulatory Visit (INDEPENDENT_AMBULATORY_CARE_PROVIDER_SITE_OTHER): Payer: 59 | Admitting: Primary Care

## 2020-07-25 ENCOUNTER — Other Ambulatory Visit: Payer: Self-pay | Admitting: Primary Care

## 2020-07-25 ENCOUNTER — Other Ambulatory Visit: Payer: Self-pay

## 2020-07-25 VITALS — BP 158/98 | HR 93 | Temp 98.3°F | Ht 68.0 in | Wt 196.5 lb

## 2020-07-25 DIAGNOSIS — I10 Essential (primary) hypertension: Secondary | ICD-10-CM | POA: Diagnosis not present

## 2020-07-25 DIAGNOSIS — R002 Palpitations: Secondary | ICD-10-CM

## 2020-07-25 DIAGNOSIS — R7989 Other specified abnormal findings of blood chemistry: Secondary | ICD-10-CM

## 2020-07-25 DIAGNOSIS — F418 Other specified anxiety disorders: Secondary | ICD-10-CM | POA: Diagnosis not present

## 2020-07-25 MED ORDER — HYDROCHLOROTHIAZIDE 12.5 MG PO TABS: 12.5000 mg | ORAL_TABLET | Freq: Every day | ORAL | 0 refills | Status: DC

## 2020-07-25 NOTE — Assessment & Plan Note (Signed)
TSH from November 2021 unremarkable. See care everywhere.

## 2020-07-25 NOTE — Assessment & Plan Note (Signed)
May need to resume intermittent FMLA for breaks, she will notify if we need to proceed.

## 2020-07-25 NOTE — Assessment & Plan Note (Addendum)
Intermittent occurring with rest only.  ECG today with NSR with rate of 97, no PAC/PVC, appears similar to ECG from 2021.  Suspect anxiety vs uncontrolled hypertension. Consider cardiology evaluation if symptoms persist after treatment.   Labs from November 2021 reviewed. Will repeat all labs in 2-3 weeks when she returns for BP check.

## 2020-07-25 NOTE — Patient Instructions (Signed)
Start taking hydrochlorothiazide 12.5 mg once daily for blood pressure.  It's important to improve your diet by reducing consumption of fast food, fried food, processed snack foods, sugary drinks. Increase consumption of fresh vegetables and fruits, whole grains, water.  Ensure you are drinking 64 ounces of water daily.  Please schedule a follow up visit to meet back with me in 2-3 weeks for blood pressure check.   It was a pleasure to see you today!

## 2020-07-25 NOTE — Assessment & Plan Note (Signed)
Uncontrolled, inconsistent use of amlodipine which caused her to feel poorly.  Rx for HCTZ 12.5 mg sent to pharmacy. We will plan to see her back in 2-3 weeks for BP check and labs.

## 2020-07-25 NOTE — Progress Notes (Signed)
Subjective:    Patient ID: Maria [name], female    DOB: 1975/05/02, 45 y.o.   MRN: [id number]  HPI  Maria [name] is a very pleasant 45 y.o. female with a history of hypertension, anemia, abnormal TSH, situational anxiety who presents today to discuss palpitations.  She is currently prescribed amlodipine 5 mg for hypertension, today she endorses that she's taken the amlodipine inconsistently as she didn't like the way it made her feel. She did wake up with nausea one night.  She was evaluated at an urgent care near her house a few months ago due to symptoms of chest tightness, headaches, fatigue. Her BP was noted to be "high" so she was prescribed a medication for which she doesn't recall or even picked up.   A friend of hers passed away recently at the age of 26 from a heart attack which has her concerned. Her palpitations began a few months ago, occurring with rest, lasting no longer with 30 minutes. She doesn't notice palpations with exercise, eating, working. She has noticed some intermittent hot flashes, has not had a menstrual cycle in about one year.   She continues to struggle with anxiety at work, is considering leaving her job due to the added stress. She may need a renewal of her FMLA for intermittent breaks throughout the day. She will notify.   BP Readings from Last 3 Encounters:  07/25/20 (!) 158/98  01/28/20 136/78  01/16/20 (!) 158/100      Review of Systems  Eyes:  Negative for visual disturbance.  Respiratory:  Negative for shortness of breath.   Cardiovascular:  Positive for palpitations. Negative for chest pain.  Neurological:  Positive for headaches.        Past Medical History:  Diagnosis Date   History of UTI    Hypertension     Social History   Socioeconomic History   Marital status: Married    Spouse name: Not on file   Number of children: Not on file   Years of education: Not on file   Highest education level: Not on file   Occupational History   Not on file  Tobacco Use   Smoking status: Never   Smokeless tobacco: Never  Vaping Use   Vaping Use: Never used  Substance and Sexual Activity   Alcohol use: No   Drug use: No   Sexual activity: Yes    Birth control/protection: None  Other Topics Concern   Not on file  Social History Narrative   Married.   2 children.   Works as an Passenger transport manager.   Enjoys relaxing.    Social Determinants of Health   Financial Resource Strain: Not on file  Food Insecurity: Not on file  Transportation Needs: Not on file  Physical Activity: Not on file  Stress: Not on file  Social Connections: Not on file  Intimate Partner Violence: Not on file    Past Surgical History:  Procedure Laterality Date   EYE SURGERY  10/2019    Family History  Problem Relation Age of Onset   Arthritis Maternal Grandmother    Diabetes Maternal Grandmother    Hypertension Maternal Grandmother    Throat cancer Maternal Grandfather    Birth defects Maternal Grandfather    Breast cancer Neg Hx     No Known Allergies  Current Outpatient Medications on File Prior to Visit  Medication Sig Dispense Refill   ergocalciferol (VITAMIN D2) 1.25 MG (50000 UT) capsule Take 1 capsule by  mouth once a week.     No current facility-administered medications on file prior to visit.    BP (!) 158/98   Pulse 93   Temp 98.3 F (36.8 C) (Temporal)   Ht 5\' 8"  (1.727 m)   Wt 196 lb 8 oz (89.1 kg)   SpO2 98%   BMI 29.88 kg/m  Objective:   Physical Exam Cardiovascular:     Rate and Rhythm: Normal rate and regular rhythm.  Pulmonary:     Effort: Pulmonary effort is normal.     Breath sounds: Normal breath sounds.  Musculoskeletal:     Cervical back: Neck supple.  Skin:    General: Skin is warm and dry.  Neurological:     Mental Status: She is alert.          Assessment & Plan:      This visit occurred during the SARS-CoV-2 public health emergency.  Safety protocols were in  place, including screening questions prior to the visit, additional usage of staff PPE, and extensive cleaning of exam room while observing appropriate contact time as indicated for disinfecting solutions.

## 2020-08-15 ENCOUNTER — Ambulatory Visit (INDEPENDENT_AMBULATORY_CARE_PROVIDER_SITE_OTHER): Payer: 59 | Admitting: Primary Care

## 2020-08-15 ENCOUNTER — Other Ambulatory Visit: Payer: Self-pay

## 2020-08-15 VITALS — BP 130/82 | HR 90 | Temp 98.6°F | Resp 16 | Ht 68.0 in | Wt 195.0 lb

## 2020-08-15 DIAGNOSIS — R7989 Other specified abnormal findings of blood chemistry: Secondary | ICD-10-CM

## 2020-08-15 DIAGNOSIS — D649 Anemia, unspecified: Secondary | ICD-10-CM

## 2020-08-15 DIAGNOSIS — R002 Palpitations: Secondary | ICD-10-CM | POA: Diagnosis not present

## 2020-08-15 DIAGNOSIS — I10 Essential (primary) hypertension: Secondary | ICD-10-CM | POA: Diagnosis not present

## 2020-08-15 LAB — COMPREHENSIVE METABOLIC PANEL
ALT: 13 U/L (ref 0–35)
AST: 14 U/L (ref 0–37)
Albumin: 4.3 g/dL (ref 3.5–5.2)
Alkaline Phosphatase: 65 U/L (ref 39–117)
BUN: 14 mg/dL (ref 6–23)
CO2: 33 mEq/L — ABNORMAL HIGH (ref 19–32)
Calcium: 9.8 mg/dL (ref 8.4–10.5)
Chloride: 102 mEq/L (ref 96–112)
Creatinine, Ser: 1.12 mg/dL (ref 0.40–1.20)
GFR: 58.92 mL/min — ABNORMAL LOW (ref >60.00)
Glucose, Bld: 93 mg/dL (ref 70–99)
Potassium: 4.2 mEq/L (ref 3.5–5.1)
Sodium: 141 mEq/L (ref 135–145)
Total Bilirubin: 0.6 mg/dL (ref 0.2–1.2)
Total Protein: 7.1 g/dL (ref 6.0–8.3)

## 2020-08-15 LAB — T4, FREE: Free T4: 0.88 ng/dL (ref 0.60–1.60)

## 2020-08-15 LAB — LIPID PANEL
Cholesterol: 215 mg/dL — ABNORMAL HIGH (ref 0–200)
HDL: 56.6 mg/dL (ref >39.00)
LDL Cholesterol: 137 mg/dL — ABNORMAL HIGH (ref 0–99)
NonHDL: 157.94
Total CHOL/HDL Ratio: 4
Triglycerides: 105 mg/dL (ref 0.0–149.0)
VLDL: 21 mg/dL (ref 0.0–40.0)

## 2020-08-15 LAB — IBC + FERRITIN
Ferritin: 25.8 ng/mL (ref 10.0–291.0)
Iron: 90 ug/dL (ref 42–145)
Saturation Ratios: 25.9 % (ref 20.0–50.0)
Transferrin: 248 mg/dL (ref 212.0–360.0)

## 2020-08-15 LAB — CBC
HCT: 40.1 % (ref 36.0–46.0)
Hemoglobin: 13.4 g/dL (ref 12.0–15.0)
MCHC: 33.4 g/dL (ref 30.0–36.0)
MCV: 89.3 fl (ref 78.0–100.0)
Platelets: 182 10*3/uL (ref 150.0–400.0)
RBC: 4.49 Mil/uL (ref 3.87–5.11)
RDW: 14.7 % (ref 11.5–15.5)
WBC: 4.1 10*3/uL (ref 4.0–10.5)

## 2020-08-15 LAB — FOLLICLE STIMULATING HORMONE: FSH: 171.3 m[IU]/mL

## 2020-08-15 LAB — LUTEINIZING HORMONE: LH: 87.7 m[IU]/mL

## 2020-08-15 LAB — TSH: TSH: 0.64 u[IU]/mL (ref 0.35–5.50)

## 2020-08-15 NOTE — Patient Instructions (Signed)
Stop by the lab prior to leaving today. I will notify you of your results once received.   Continue the hydrochlorothiazide blood pressure medication for now.   It was a pleasure to see you today!

## 2020-08-15 NOTE — Progress Notes (Signed)
Subjective:    Patient ID: Maria [name], female    DOB: [birth year], 45 y.o.   MRN: [id number]  HPI  Maria [name] is a very pleasant 45 y.o. female with a history of hypertension, anemia, palpitations who presents today for follow up of hypertension.  She was last evaluated 07/25/20 for symptoms of palpitations, BP was uncontrolled due to inconsistent use of amlodipine. Amlodipine caused her to feel poorly. During her last visit we initiated HCTZ 12.5 mg. She is here today for follow up and BMP.  Since her last visit she's compliant to HCTZ 12.5 mg. She denies chest pain, dizziness, headaches, palpitations. She's feeling better.   She is also due for repeat labs including TSH, lipids, iron levels. She would like labs drawn for evaluation of menopause. She is not taking oral iron.   BP Readings from Last 3 Encounters:  08/15/20 130/82  07/25/20 (!) 158/98  01/28/20 136/78        Review of Systems  Respiratory:  Negative for shortness of breath.   Cardiovascular:  Negative for chest pain and palpitations.  Neurological:  Negative for headaches.        Past Medical History:  Diagnosis Date   History of UTI    Hypertension     Social History   Socioeconomic History   Marital status: Married    Spouse name: Not on file   Number of children: Not on file   Years of education: Not on file   Highest education level: Not on file  Occupational History   Not on file  Tobacco Use   Smoking status: Never   Smokeless tobacco: Never  Vaping Use   Vaping Use: Never used  Substance and Sexual Activity   Alcohol use: No   Drug use: No   Sexual activity: Yes    Birth control/protection: None  Other Topics Concern   Not on file  Social History Narrative   Married.   2 children.   Works as an Passenger transport manager.   Enjoys relaxing.    Social Determinants of Health   Financial Resource Strain: Not on file  Food Insecurity: Not on file  Transportation Needs:  Not on file  Physical Activity: Not on file  Stress: Not on file  Social Connections: Not on file  Intimate Partner Violence: Not on file    Past Surgical History:  Procedure Laterality Date   EYE SURGERY  10/2019    Family History  Problem Relation Age of Onset   Arthritis Maternal Grandmother    Diabetes Maternal Grandmother    Hypertension Maternal Grandmother    Throat cancer Maternal Grandfather    Birth defects Maternal Grandfather    Breast cancer Neg Hx     No Known Allergies  Current Outpatient Medications on File Prior to Visit  Medication Sig Dispense Refill   ergocalciferol (VITAMIN D2) 1.25 MG (50000 UT) capsule Take 1 capsule by mouth once a week.     hydrochlorothiazide (HYDRODIURIL) 12.5 MG tablet Take 1 tablet (12.5 mg total) by mouth daily. For blood pressure. 30 tablet 0   No current facility-administered medications on file prior to visit.    BP 130/82   Pulse 90   Temp 98.6 F (37 C)   Resp 16   Ht 5\' 8"  (1.727 m)   Wt 195 lb (88.5 kg)   SpO2 98%   BMI 29.65 kg/m  Objective:   Physical Exam Cardiovascular:     Rate and Rhythm: Normal  rate and regular rhythm.  Pulmonary:     Effort: Pulmonary effort is normal.     Breath sounds: Normal breath sounds.  Musculoskeletal:     Cervical back: Neck supple.  Skin:    General: Skin is warm and dry.          Assessment & Plan:      This visit occurred during the SARS-CoV-2 public health emergency.  Safety protocols were in place, including screening questions prior to the visit, additional usage of staff PPE, and extensive cleaning of exam room while observing appropriate contact time as indicated for disinfecting solutions.

## 2020-08-15 NOTE — Assessment & Plan Note (Signed)
Not taking oral iron. Repeat CBC and iron levels pending.

## 2020-08-15 NOTE — Assessment & Plan Note (Signed)
Improved on HCTZ 12.5 mg.  Checking CMP today, will send refills once labs return.

## 2020-08-15 NOTE — Assessment & Plan Note (Signed)
Repeat labs pending, TSH and Free T4

## 2020-08-15 NOTE — Assessment & Plan Note (Addendum)
Resolved since last visit, suspect uncontrolled HTN to be contributing. BP is improved.   Checking labs today including TSH, CBC, hormone levels, CMP.

## 2020-08-16 ENCOUNTER — Other Ambulatory Visit: Payer: Self-pay | Admitting: Primary Care

## 2020-08-16 DIAGNOSIS — I10 Essential (primary) hypertension: Secondary | ICD-10-CM

## 2020-08-16 MED ORDER — HYDROCHLOROTHIAZIDE 12.5 MG PO TABS: 12.5000 mg | ORAL_TABLET | Freq: Every day | ORAL | 3 refills | Status: DC

## 2020-08-21 LAB — ESTROGENS, TOTAL: Estrogen: 79.9 pg/mL

## 2020-09-22 ENCOUNTER — Telehealth: Payer: Self-pay

## 2020-09-22 NOTE — Telephone Encounter (Signed)
Semi completed paperwork placed in PCP's inbox for review, completion, sign and date

## 2020-09-22 NOTE — Telephone Encounter (Signed)
Maria [name] is asking if you have received FMLA forms for her. Please advise. She is going out of town tomorrow. She will drop them off today just in case we did not receive them.

## 2020-09-22 NOTE — Telephone Encounter (Signed)
Notified pt we received paperwork.  Beginning forms now for continued intermittent leave as before for 6 month period

## 2020-09-23 NOTE — Telephone Encounter (Signed)
Completed and placed on Tamera's desk.

## 2020-09-23 NOTE — Telephone Encounter (Signed)
Informed pt paperwork completed and faxed  Copy mailed to pt  Copy for scan   Copy retained by me

## 2020-10-30 ENCOUNTER — Other Ambulatory Visit: Payer: Self-pay | Admitting: Primary Care

## 2020-10-30 ENCOUNTER — Encounter: Payer: Self-pay | Admitting: Primary Care

## 2020-10-30 ENCOUNTER — Ambulatory Visit (INDEPENDENT_AMBULATORY_CARE_PROVIDER_SITE_OTHER): Payer: 59 | Admitting: Primary Care

## 2020-10-30 ENCOUNTER — Ambulatory Visit: Payer: 59 | Admitting: Primary Care

## 2020-10-30 ENCOUNTER — Other Ambulatory Visit: Payer: Self-pay

## 2020-10-30 VITALS — BP 126/82 | HR 81 | Temp 98.6°F | Ht 68.0 in | Wt 195.0 lb

## 2020-10-30 DIAGNOSIS — R3129 Other microscopic hematuria: Secondary | ICD-10-CM | POA: Diagnosis not present

## 2020-10-30 DIAGNOSIS — H938X3 Other specified disorders of ear, bilateral: Secondary | ICD-10-CM

## 2020-10-30 DIAGNOSIS — R350 Frequency of micturition: Secondary | ICD-10-CM | POA: Insufficient documentation

## 2020-10-30 HISTORY — DX: Other microscopic hematuria: R31.29

## 2020-10-30 LAB — URINALYSIS, MICROSCOPIC ONLY

## 2020-10-30 LAB — POC URINALSYSI DIPSTICK (AUTOMATED)
Bilirubin, UA: NEGATIVE
Glucose, UA: NEGATIVE
Ketones, UA: NEGATIVE
Leukocytes, UA: NEGATIVE
Nitrite, UA: NEGATIVE
Protein, UA: NEGATIVE
Spec Grav, UA: 1.015 (ref 1.010–1.025)
Urobilinogen, UA: 0.2 E.U./dL
pH, UA: 6 (ref 5.0–8.0)

## 2020-10-30 MED ORDER — FLUTICASONE PROPIONATE 50 MCG/ACT NA SUSP: 1.0000 | Freq: Two times a day (BID) | NASAL | 0 refills | Status: DC

## 2020-10-30 NOTE — Progress Notes (Signed)
Subjective:    Patient ID: Maria [name], female    DOB: 1975-11-24, 45 y.o.   MRN: [id number]  HPI  Maria [name] is a very pleasant 45 y.o. female who presents today to discuss ear fullness and urinary frequency.  She also reports itching to her ears, scratchy throat, post nasal drip. Symptoms are located bilaterally, right worse than left. She denies cough, fevers, chills.   She would also like to discuss chronic urinary frequency, urinating around 10 times daily with a sensation of incomplete bladder emptying.  She's also noticed a burning to the right mid to lower abdomen at times when urinating. She drinks some water, sweet tea, one cup of coffee.   She was looking at her records and noticed trace blood in a few of her urine test results. She's never been notified of this. She denies a family history of bladder cancer. She denies gross hematuria.     BP Readings from Last 3 Encounters:  10/30/20 126/82  08/15/20 130/82  07/25/20 (!) 158/98        Review of Systems  Constitutional:  Negative for unexpected weight change.  HENT:  Positive for postnasal drip. Negative for ear pain.        Ear fullness   Genitourinary:  Positive for frequency. Negative for difficulty urinating, dysuria, hematuria, urgency and vaginal discharge.        Past Medical History:  Diagnosis Date   History of UTI    Hypertension     Social History   Socioeconomic History   Marital status: Married    Spouse name: Not on file   Number of children: Not on file   Years of education: Not on file   Highest education level: Not on file  Occupational History   Not on file  Tobacco Use   Smoking status: Never   Smokeless tobacco: Never  Vaping Use   Vaping Use: Never used  Substance and Sexual Activity   Alcohol use: No   Drug use: No   Sexual activity: Yes    Birth control/protection: None  Other Topics Concern   Not on file  Social History Narrative   Married.   2  children.   Works as an Passenger transport manager.   Enjoys relaxing.    Social Determinants of Health   Financial Resource Strain: Not on file  Food Insecurity: Not on file  Transportation Needs: Not on file  Physical Activity: Not on file  Stress: Not on file  Social Connections: Not on file  Intimate Partner Violence: Not on file    Past Surgical History:  Procedure Laterality Date   EYE SURGERY  10/2019    Family History  Problem Relation Age of Onset   Arthritis Maternal Grandmother    Diabetes Maternal Grandmother    Hypertension Maternal Grandmother    Throat cancer Maternal Grandfather    Birth defects Maternal Grandfather    Breast cancer Neg Hx     No Known Allergies  Current Outpatient Medications on File Prior to Visit  Medication Sig Dispense Refill   ergocalciferol (VITAMIN D2) 1.25 MG (50000 UT) capsule Take 1 capsule by mouth once a week.     hydrochlorothiazide (HYDRODIURIL) 12.5 MG tablet Take 1 tablet (12.5 mg total) by mouth daily. For blood pressure. 90 tablet 3   No current facility-administered medications on file prior to visit.    BP 126/82   Pulse 81   Temp 98.6 F (37 C) (Temporal)  Ht '5\' 8"'$  (1.727 m)   Wt 195 lb (88.5 kg)   SpO2 97%   BMI 29.65 kg/m  Objective:   Physical Exam HENT:     Right Ear: Tympanic membrane and ear canal normal.     Left Ear: Tympanic membrane and ear canal normal.  Cardiovascular:     Rate and Rhythm: Normal rate.  Pulmonary:     Effort: Pulmonary effort is normal.  Musculoskeletal:     Cervical back: Neck supple.  Skin:    General: Skin is warm and dry.          Assessment & Plan:      This visit occurred during the SARS-CoV-2 public health emergency.  Safety protocols were in place, including screening questions prior to the visit, additional usage of staff PPE, and extensive cleaning of exam room while observing appropriate contact time as indicated for disinfecting solutions.

## 2020-10-30 NOTE — Assessment & Plan Note (Addendum)
Noted in UA results from [facility] from November and September 2021. Also intermittently in earlier years.  UA today with trace blood, otherwise negative.  Await urine micro results.   Consider Urology evaluation.

## 2020-10-30 NOTE — Patient Instructions (Signed)
Nasal Congestion/Ear Pressure/Sinus Pressure: Try using Flonase (fluticasone) nasal spray. Instill 1 spray in each nostril twice daily.   I will be in touch regarding the final urine results.   It was a pleasure to see you today!

## 2020-10-30 NOTE — Assessment & Plan Note (Addendum)
Chronic for years, also with sensation of incomplete bladder emptying.   UA today with trace blood, otherwise negative.  Discussed to limit caffeine and other bladder stimulant products.   Consider Urology evaluation.

## 2020-10-30 NOTE — Assessment & Plan Note (Signed)
Acute recently.  Exam today benign.  Discussed use of Flonase for potential effusion. No infection.  Rx for Flonase sent to pharmacy.

## 2020-12-10 ENCOUNTER — Emergency Department (HOSPITAL_COMMUNITY)
Admission: EM | Admit: 2020-12-10 | Discharge: 2020-12-10 | Disposition: A | Discharge status: Home / Self Care | Payer: 59 | Attending: Emergency Medicine | Admitting: Emergency Medicine

## 2020-12-10 ENCOUNTER — Encounter (HOSPITAL_COMMUNITY): Payer: Self-pay

## 2020-12-10 ENCOUNTER — Other Ambulatory Visit: Payer: Self-pay

## 2020-12-10 DIAGNOSIS — R0789 Other chest pain: Secondary | ICD-10-CM | POA: Insufficient documentation

## 2020-12-10 DIAGNOSIS — M79601 Pain in right arm: Secondary | ICD-10-CM | POA: Insufficient documentation

## 2020-12-10 DIAGNOSIS — Z5321 Procedure and treatment not carried out due to patient leaving prior to being seen by health care provider: Secondary | ICD-10-CM | POA: Insufficient documentation

## 2020-12-10 LAB — CBC WITH DIFFERENTIAL/PLATELET
Abs Immature Granulocytes: 0.02 10*3/uL (ref 0.00–0.07)
Basophils Absolute: 0 10*3/uL (ref 0.0–0.1)
Basophils Relative: 1 %
Eosinophils Absolute: 0.1 10*3/uL (ref 0.0–0.5)
Eosinophils Relative: 3 %
HCT: 40.6 % (ref 36.0–46.0)
Hemoglobin: 13.4 g/dL (ref 12.0–15.0)
Immature Granulocytes: 0 %
Lymphocytes Relative: 37 %
Lymphs Abs: 1.8 10*3/uL (ref 0.7–4.0)
MCH: 29.6 pg (ref 26.0–34.0)
MCHC: 33 g/dL (ref 30.0–36.0)
MCV: 89.8 fL (ref 80.0–100.0)
Monocytes Absolute: 0.5 10*3/uL (ref 0.1–1.0)
Monocytes Relative: 10 %
Neutro Abs: 2.4 10*3/uL (ref 1.7–7.7)
Neutrophils Relative %: 49 %
Platelets: 187 10*3/uL (ref 150–400)
RBC: 4.52 MIL/uL (ref 3.87–5.11)
RDW: 14.6 % (ref 11.5–15.5)
WBC: 4.9 10*3/uL (ref 4.0–10.5)
nRBC: 0 % (ref 0.0–0.2)

## 2020-12-10 LAB — COMPREHENSIVE METABOLIC PANEL
ALT: 14 U/L (ref 0–44)
AST: 17 U/L (ref 15–41)
Albumin: 4 g/dL (ref 3.5–5.0)
Alkaline Phosphatase: 65 U/L (ref 38–126)
Anion gap: 5 (ref 5–15)
BUN: 17 mg/dL (ref 6–20)
CO2: 30 mmol/L (ref 22–32)
Calcium: 9.4 mg/dL (ref 8.9–10.3)
Chloride: 104 mmol/L (ref 98–111)
Creatinine, Ser: 1.17 mg/dL — ABNORMAL HIGH (ref 0.44–1.00)
GFR, Estimated: 58 mL/min — ABNORMAL LOW (ref >60)
Glucose, Bld: 118 mg/dL — ABNORMAL HIGH (ref 70–99)
Potassium: 3.7 mmol/L (ref 3.5–5.1)
Sodium: 139 mmol/L (ref 135–145)
Total Bilirubin: 0.4 mg/dL (ref 0.3–1.2)
Total Protein: 7.5 g/dL (ref 6.5–8.1)

## 2020-12-10 LAB — URINALYSIS, ROUTINE W REFLEX MICROSCOPIC
Bacteria, UA: NONE SEEN
Bilirubin Urine: NEGATIVE
Glucose, UA: NEGATIVE mg/dL
Ketones, ur: NEGATIVE mg/dL
Leukocytes,Ua: NEGATIVE
Nitrite: NEGATIVE
Protein, ur: NEGATIVE mg/dL
Specific Gravity, Urine: 1.017 (ref 1.005–1.030)
pH: 6 (ref 5.0–8.0)

## 2020-12-10 LAB — I-STAT BETA HCG BLOOD, ED (MC, WL, AP ONLY): I-stat hCG, quantitative: <5 m[IU]/mL (ref <5)

## 2020-12-10 NOTE — ED Triage Notes (Signed)
Pt complains of a strange feeling in her right arm and tightness in her chest x 2 days. Pt states that she feels it more when she lies down for bed.

## 2020-12-10 NOTE — ED Notes (Signed)
Labeled specimen cup provided to pt for urine collection per MD order. ENMiles 

## 2020-12-31 ENCOUNTER — Encounter: Payer: Self-pay | Admitting: Primary Care

## 2020-12-31 ENCOUNTER — Ambulatory Visit (INDEPENDENT_AMBULATORY_CARE_PROVIDER_SITE_OTHER): Payer: 59 | Admitting: Primary Care

## 2020-12-31 ENCOUNTER — Other Ambulatory Visit: Payer: Self-pay

## 2020-12-31 VITALS — BP 138/82 | HR 86 | Temp 97.8°F | Ht 68.0 in | Wt 195.0 lb

## 2020-12-31 DIAGNOSIS — Z0001 Encounter for general adult medical examination with abnormal findings: Secondary | ICD-10-CM

## 2020-12-31 DIAGNOSIS — R7989 Other specified abnormal findings of blood chemistry: Secondary | ICD-10-CM

## 2020-12-31 DIAGNOSIS — R350 Frequency of micturition: Secondary | ICD-10-CM

## 2020-12-31 DIAGNOSIS — R0789 Other chest pain: Secondary | ICD-10-CM | POA: Insufficient documentation

## 2020-12-31 DIAGNOSIS — R3129 Other microscopic hematuria: Secondary | ICD-10-CM

## 2020-12-31 DIAGNOSIS — I10 Essential (primary) hypertension: Secondary | ICD-10-CM

## 2020-12-31 DIAGNOSIS — Z23 Encounter for immunization: Secondary | ICD-10-CM

## 2020-12-31 DIAGNOSIS — Z114 Encounter for screening for human immunodeficiency virus [HIV]: Secondary | ICD-10-CM | POA: Diagnosis not present

## 2020-12-31 DIAGNOSIS — Z0000 Encounter for general adult medical examination without abnormal findings: Secondary | ICD-10-CM | POA: Insufficient documentation

## 2020-12-31 DIAGNOSIS — Z1211 Encounter for screening for malignant neoplasm of colon: Secondary | ICD-10-CM

## 2020-12-31 DIAGNOSIS — E559 Vitamin D deficiency, unspecified: Secondary | ICD-10-CM | POA: Diagnosis not present

## 2020-12-31 DIAGNOSIS — F411 Generalized anxiety disorder: Secondary | ICD-10-CM

## 2020-12-31 DIAGNOSIS — Z1159 Encounter for screening for other viral diseases: Secondary | ICD-10-CM

## 2020-12-31 LAB — COMPREHENSIVE METABOLIC PANEL
ALT: 13 U/L (ref 0–35)
AST: 14 U/L (ref 0–37)
Albumin: 4.2 g/dL (ref 3.5–5.2)
Alkaline Phosphatase: 70 U/L (ref 39–117)
BUN: 18 mg/dL (ref 6–23)
CO2: 30 mEq/L (ref 19–32)
Calcium: 9.5 mg/dL (ref 8.4–10.5)
Chloride: 104 mEq/L (ref 96–112)
Creatinine, Ser: 1.05 mg/dL (ref 0.40–1.20)
GFR: 63.49 mL/min (ref >60.00)
Glucose, Bld: 96 mg/dL (ref 70–99)
Potassium: 4.4 mEq/L (ref 3.5–5.1)
Sodium: 141 mEq/L (ref 135–145)
Total Bilirubin: 0.5 mg/dL (ref 0.2–1.2)
Total Protein: 7.1 g/dL (ref 6.0–8.3)

## 2020-12-31 LAB — LIPID PANEL
Cholesterol: 207 mg/dL — ABNORMAL HIGH (ref 0–200)
HDL: 53 mg/dL (ref >39.00)
LDL Cholesterol: 139 mg/dL — ABNORMAL HIGH (ref 0–99)
NonHDL: 153.99
Total CHOL/HDL Ratio: 4
Triglycerides: 75 mg/dL (ref 0.0–149.0)
VLDL: 15 mg/dL (ref 0.0–40.0)

## 2020-12-31 LAB — CBC
HCT: 40.7 % (ref 36.0–46.0)
Hemoglobin: 13.5 g/dL (ref 12.0–15.0)
MCHC: 33.1 g/dL (ref 30.0–36.0)
MCV: 88.3 fl (ref 78.0–100.0)
Platelets: 188 10*3/uL (ref 150.0–400.0)
RBC: 4.61 Mil/uL (ref 3.87–5.11)
RDW: 14.7 % (ref 11.5–15.5)
WBC: 4 10*3/uL (ref 4.0–10.5)

## 2020-12-31 LAB — POC URINALSYSI DIPSTICK (AUTOMATED)
Bilirubin, UA: NEGATIVE
Glucose, UA: NEGATIVE
Ketones, UA: NEGATIVE
Leukocytes, UA: NEGATIVE
Protein, UA: NEGATIVE
Spec Grav, UA: 1.02 (ref 1.010–1.025)
Urobilinogen, UA: 0.2 E.U./dL
pH, UA: 6 (ref 5.0–8.0)

## 2020-12-31 LAB — VITAMIN D 25 HYDROXY (VIT D DEFICIENCY, FRACTURES): VITD: 44.84 ng/mL (ref 30.00–100.00)

## 2020-12-31 LAB — URINALYSIS, MICROSCOPIC ONLY

## 2020-12-31 LAB — TSH: TSH: 0.65 u[IU]/mL (ref 0.35–5.50)

## 2020-12-31 NOTE — Assessment & Plan Note (Signed)
Urine microscopic in September 2022 without RBC's.  Repeat UA and microscopic pending today. Referral placed to Urology for chronic urinary frequency mostly, will also mention microscopic hematuria.

## 2020-12-31 NOTE — Progress Notes (Signed)
Subjective:    Patient ID: Maria [name], female    DOB: November 20, 1975, 45 y.o.   MRN: [id number]  HPI  Maria [name] is a very pleasant 45 y.o. female who presents today for complete physical and follow up of chronic conditions. She would also like to discuss several issues.   She has also noticed chest heaviness. She presented to [facility] on 12/10/20 for chest heaviness. She was triaged, completed ECG and labs. Her heaviness is felt to her substernal chest region for which began about 3 weeks ago. Initially her heaviness was constant for about 1-2 weeks, no chest tightness in 1 week.   Chronic anxiety for years, mostly work induced, but also going through menopause, son is about to graduate high school. She's tried to handle her symptoms on her own, but has not been successful. Currently managed on FMLA for additional breaks during her workday. Symptoms include worried about her health, feeling nervous and anxious, increased irritability, difficulty sleeping consecutive nights due to mind racing thoughts.   She is wanting to take a leave of absence from work beginning after the holidays, around early January 2023.   Chronic urinary frequency and pelvic pressure for years. Chronic microscopic hematuria. Has never been seen by Urology.   Immunizations: -Tetanus: Unsure.  -Influenza: Due today  -Covid-19: 2 vaccines  Diet: Fair diet.  Exercise: Walking 2 miles daily, rides bike, exercises several days weekly.   Eye exam: Completes annually  Dental exam: Completes semi-annually   Pap Smear: UTD. Follows with GYN. Mammogram: Completed in May 2022 Colonoscopy: Never completed  BP Readings from Last 3 Encounters:  12/31/20 138/82  12/10/20 (!) 174/94  10/30/20 126/82         Review of Systems  Constitutional:  Negative for unexpected weight change.  HENT:  Negative for rhinorrhea.   Eyes:  Negative for visual disturbance.       Floaters   Respiratory:  Negative for  shortness of breath.   Cardiovascular:  Negative for chest pain.  Gastrointestinal:  Negative for constipation and diarrhea.  Genitourinary:  Positive for frequency and menstrual problem. Negative for difficulty urinating and hematuria.  Musculoskeletal:  Negative for arthralgias and myalgias.  Skin:  Negative for rash.  Allergic/Immunologic: Negative for environmental allergies.  Neurological:  Negative for dizziness, numbness and headaches.  Psychiatric/Behavioral:  The patient is nervous/anxious.         HPI        Past Medical History:  Diagnosis Date   History of UTI    Hypertension     Social History   Socioeconomic History   Marital status: Married    Spouse name: Not on file   Number of children: Not on file   Years of education: Not on file   Highest education level: Not on file  Occupational History   Not on file  Tobacco Use   Smoking status: Never   Smokeless tobacco: Never  Vaping Use   Vaping Use: Never used  Substance and Sexual Activity   Alcohol use: No   Drug use: No   Sexual activity: Yes    Birth control/protection: None  Other Topics Concern   Not on file  Social History Narrative   Married.   2 children.   Works as an Passenger transport manager.   Enjoys relaxing.    Social Determinants of Health   Financial Resource Strain: Not on file  Food Insecurity: Not on file  Transportation Needs: Not on file  Physical  Activity: Not on file  Stress: Not on file  Social Connections: Not on file  Intimate Partner Violence: Not on file    Past Surgical History:  Procedure Laterality Date   EYE SURGERY  10/2019    Family History  Problem Relation Age of Onset   Arthritis Maternal Grandmother    Diabetes Maternal Grandmother    Hypertension Maternal Grandmother    Throat cancer Maternal Grandfather    Birth defects Maternal Grandfather    Breast cancer Neg Hx     No Known Allergies  Current Outpatient Medications on File Prior to Visit   Medication Sig Dispense Refill   ergocalciferol (VITAMIN D2) 1.25 MG (50000 UT) capsule Take 1 capsule by mouth once a week.     fluticasone (FLONASE) 50 MCG/ACT nasal spray Place 1 spray into both nostrils 2 (two) times daily. 16 g 0   hydrochlorothiazide (HYDRODIURIL) 12.5 MG tablet Take 1 tablet (12.5 mg total) by mouth daily. For blood pressure. 90 tablet 3   No current facility-administered medications on file prior to visit.    There were no vitals taken for this visit. Objective:   Physical Exam HENT:     Right Ear: Tympanic membrane and ear canal normal.     Left Ear: Tympanic membrane and ear canal normal.     Nose: Nose normal.  Eyes:     Conjunctiva/sclera: Conjunctivae normal.     Pupils: Pupils are equal, round, and reactive to light.  Neck:     Thyroid: No thyromegaly.  Cardiovascular:     Rate and Rhythm: Normal rate and regular rhythm.     Heart sounds: No murmur heard. Pulmonary:     Effort: Pulmonary effort is normal.     Breath sounds: Normal breath sounds. No rales.  Abdominal:     General: Bowel sounds are normal.     Palpations: Abdomen is soft.     Tenderness: There is no abdominal tenderness.  Musculoskeletal:        General: Normal range of motion.     Cervical back: Neck supple.  Lymphadenopathy:     Cervical: No cervical adenopathy.  Skin:    General: Skin is warm and dry.     Findings: No rash.  Neurological:     Mental Status: She is alert and oriented to person, place, and time.     Cranial Nerves: No cranial nerve deficit.     Deep Tendon Reflexes: Reflexes are normal and symmetric.  Psychiatric:     Comments: Appears anxious          Assessment & Plan:      This visit occurred during the SARS-CoV-2 public health emergency.  Safety protocols were in place, including screening questions prior to the visit, additional usage of staff PPE, and extensive cleaning of exam room while observing appropriate contact time as indicated for  disinfecting solutions.

## 2020-12-31 NOTE — Assessment & Plan Note (Signed)
Suspect anxiety, patient agrees.  ECG from recent ED visit reviewed which shows NSR with rate of 85, normal axis, no PAC/PCV, no acute ST changes.  Checking labs today.  Referral for therapy placed. Consider cardiology referral if symptoms return/persist.

## 2020-12-31 NOTE — Assessment & Plan Note (Signed)
Recent labs unremarkable.

## 2020-12-31 NOTE — Assessment & Plan Note (Signed)
Influenza vaccine provided today. Declines tetanus.  Pap smear UTD, follows with GYN. Mammogram UTD. Colonoscopy due, referral placed to GI.  Discussed the importance of a healthy diet and regular exercise in order for weight loss, and to reduce the risk of further co-morbidity.  Exam as noted. Labs pending.

## 2020-12-31 NOTE — Assessment & Plan Note (Signed)
She agrees to Urology evaluation. Referral placed.

## 2020-12-31 NOTE — Patient Instructions (Signed)
Stop by the lab prior to leaving today. I will notify you of your results once received.   You will be contacted regarding your referral to Urology and therapy.  Please let us know if you have not been contacted within two weeks.   Your blood pressure should run less than 130 on top and less than 90 on bottom.  Please update me regarding your intended work leave date and duration.  It was a pleasure to see you today!  Preventive Care 74-47 Years Old, Female Preventive care refers to lifestyle choices and visits with your health care provider that can promote health and wellness. Preventive care visits are also called wellness exams. What can I expect for my preventive care visit? Counseling Your health care provider may ask you questions about your: Medical history, including: Past medical problems. Family medical history. Pregnancy history. Current health, including: Menstrual cycle. Method of birth control. Emotional well-being. Home life and relationship well-being. Sexual activity and sexual health. Lifestyle, including: Alcohol, nicotine or tobacco, and drug use. Access to firearms. Diet, exercise, and sleep habits. Work and work Statistician. Sunscreen use. Safety issues such as seatbelt and bike helmet use. Physical exam Your health care provider will check your: Height and weight. These may be used to calculate your BMI (body mass index). BMI is a measurement that tells if you are at a healthy weight. Waist circumference. This measures the distance around your waistline. This measurement also tells if you are at a healthy weight and may help predict your risk of certain diseases, such as type 2 diabetes and high blood pressure. Heart rate and blood pressure. Body temperature. Skin for abnormal spots. What immunizations do I need? Vaccines are usually given at various ages, according to a schedule. Your health care provider will recommend vaccines for you based on your  age, medical history, and lifestyle or other factors, such as travel or where you work. What tests do I need? Screening Your health care provider may recommend screening tests for certain conditions. This may include: Lipid and cholesterol levels. Diabetes screening. This is done by checking your blood sugar (glucose) after you have not eaten for a while (fasting). Pelvic exam and Pap test. Hepatitis B test. Hepatitis C test. HIV (human immunodeficiency virus) test. STI (sexually transmitted infection) testing, if you are at risk. Lung cancer screening. Colorectal cancer screening. Mammogram. Talk with your health care provider about when you should start having regular mammograms. This may depend on whether you have a family history of breast cancer. BRCA-related cancer screening. This may be done if you have a family history of breast, ovarian, tubal, or peritoneal cancers. Bone density scan. This is done to screen for osteoporosis. Talk with your health care provider about your test results, treatment options, and if necessary, the need for more tests. Follow these instructions at home: Eating and drinking  Eat a diet that includes fresh fruits and vegetables, whole grains, lean protein, and low-fat dairy products. Take vitamin and mineral supplements as recommended by your health care provider. Do not drink alcohol if: Your health care provider tells you not to drink. You are pregnant, may be pregnant, or are planning to become pregnant. If you drink alcohol: Limit how much you have to 0-1 drink a day. Know how much alcohol is in your drink. In the [place] one drink equals one 12 oz bottle of beer (355 mL), one 5 oz glass of wine (148 mL), or one 1 oz glass of hard liquor (  44 mL). Lifestyle Brush your teeth every morning and night with fluoride toothpaste. Floss one time each day. Exercise for at least 30 minutes 5 or more days each week. Do not use any products that contain  nicotine or tobacco. These products include cigarettes, chewing tobacco, and vaping devices, such as e-cigarettes. If you need help quitting, ask your health care provider. Do not use drugs. If you are sexually active, practice safe sex. Use a condom or other form of protection to prevent STIs. If you do not wish to become pregnant, use a form of birth control. If you plan to become pregnant, see your health care provider for a prepregnancy visit. Take aspirin only as told by your health care provider. Make sure that you understand how much to take and what form to take. Work with your health care provider to find out whether it is safe and beneficial for you to take aspirin daily. Find healthy ways to manage stress, such as: Meditation, yoga, or listening to music. Journaling. Talking to a trusted person. Spending time with friends and family. Minimize exposure to UV radiation to reduce your risk of skin cancer. Safety Always wear your seat belt while driving or riding in a vehicle. Do not drive: If you have been drinking alcohol. Do not ride with someone who has been drinking. When you are tired or distracted. While texting. If you have been using any mind-altering substances or drugs. Wear a helmet and other protective equipment during sports activities. If you have firearms in your house, make sure you follow all gun safety procedures. Seek help if you have been physically or sexually abused. What's next? Visit your health care provider once a year for an annual wellness visit. Ask your health care provider how often you should have your eyes and teeth checked. Stay up to date on all vaccines. This information is not intended to replace advice given to you by your health care provider. Make sure you discuss any questions you have with your health care provider. Document Revised: 07/30/2020 Document Reviewed: 07/30/2020 Elsevier Patient Education  [place].

## 2020-12-31 NOTE — Assessment & Plan Note (Signed)
Continued, more frequent.  Discussed options for treatment, she opts for therapy. Referral placed.  Agree to FMLA/leave of absence from work in early 2023. She will determine start date and length of time needed and will get back with me soon.

## 2020-12-31 NOTE — Assessment & Plan Note (Signed)
Slightly high today, she does appear anxious.  Discussed to monitor at home, notify if readings are consistently at or above 130/90.Marland Kitchen  Continue HCTZ 12.5 mg. CMP pending.

## 2021-01-01 ENCOUNTER — Telehealth: Payer: Self-pay | Admitting: Primary Care

## 2021-01-01 LAB — HIV ANTIBODY (ROUTINE TESTING W REFLEX): HIV 1&2 Ab, 4th Generation: NONREACTIVE

## 2021-01-01 LAB — HEPATITIS C ANTIBODY
Hepatitis C Ab: NONREACTIVE
SIGNAL TO CUT-OFF: 0.03 (ref <1.00)

## 2021-01-01 NOTE — Telephone Encounter (Signed)
Please apologize as I have not had a chance to review her lab results. I will notify her via my chart by tomorrow.   At quick glance it doesn't appear that she has a UTI. If symptoms have progressed then we can obtain another sample.  Let me know.

## 2021-01-01 NOTE — Telephone Encounter (Signed)
Phone note has been sent about this as well.

## 2021-01-05 NOTE — Telephone Encounter (Signed)
Left message to return call to our office.  

## 2021-01-05 NOTE — Telephone Encounter (Signed)
Called patient she states that she has appointment with GYN and will have them check today. If any new questions or symptoms she will let us know.

## 2021-01-15 ENCOUNTER — Encounter: Payer: Self-pay | Admitting: *Deleted

## 2021-01-21 ENCOUNTER — Telehealth: Payer: Self-pay | Admitting: Primary Care

## 2021-01-21 NOTE — Telephone Encounter (Signed)
Pt called in wanting to  know who she was referred to for her colonoscopy.

## 2021-01-21 NOTE — Telephone Encounter (Signed)
Called patient referral was sent to [facility]. I have provided contact number to patient. She will call if any questions.

## 2021-01-30 ENCOUNTER — Encounter: Payer: Self-pay | Admitting: Gastroenterology

## 2021-03-03 ENCOUNTER — Emergency Department (HOSPITAL_COMMUNITY)
Admission: EM | Admit: 2021-03-03 | Discharge: 2021-03-03 | Disposition: A | Discharge status: Home / Self Care | Payer: 59 | Attending: Emergency Medicine | Admitting: Emergency Medicine

## 2021-03-03 ENCOUNTER — Telehealth: Payer: Self-pay

## 2021-03-03 ENCOUNTER — Emergency Department (HOSPITAL_COMMUNITY): Payer: 59

## 2021-03-03 DIAGNOSIS — R5383 Other fatigue: Secondary | ICD-10-CM | POA: Insufficient documentation

## 2021-03-03 DIAGNOSIS — R42 Dizziness and giddiness: Secondary | ICD-10-CM

## 2021-03-03 DIAGNOSIS — R0602 Shortness of breath: Secondary | ICD-10-CM | POA: Insufficient documentation

## 2021-03-03 DIAGNOSIS — N9489 Other specified conditions associated with female genital organs and menstrual cycle: Secondary | ICD-10-CM | POA: Insufficient documentation

## 2021-03-03 DIAGNOSIS — I10 Essential (primary) hypertension: Secondary | ICD-10-CM | POA: Diagnosis not present

## 2021-03-03 DIAGNOSIS — R072 Precordial pain: Secondary | ICD-10-CM | POA: Diagnosis present

## 2021-03-03 LAB — BASIC METABOLIC PANEL
Anion gap: 8 (ref 5–15)
BUN: 12 mg/dL (ref 6–20)
CO2: 28 mmol/L (ref 22–32)
Calcium: 9.3 mg/dL (ref 8.9–10.3)
Chloride: 104 mmol/L (ref 98–111)
Creatinine, Ser: 1.09 mg/dL — ABNORMAL HIGH (ref 0.44–1.00)
GFR, Estimated: >60 mL/min (ref >60)
Glucose, Bld: 104 mg/dL — ABNORMAL HIGH (ref 70–99)
Potassium: 3.5 mmol/L (ref 3.5–5.1)
Sodium: 140 mmol/L (ref 135–145)

## 2021-03-03 LAB — CBC
HCT: 39.4 % (ref 36.0–46.0)
Hemoglobin: 12.6 g/dL (ref 12.0–15.0)
MCH: 29 pg (ref 26.0–34.0)
MCHC: 32 g/dL (ref 30.0–36.0)
MCV: 90.8 fL (ref 80.0–100.0)
Platelets: 245 10*3/uL (ref 150–400)
RBC: 4.34 MIL/uL (ref 3.87–5.11)
RDW: 14.5 % (ref 11.5–15.5)
WBC: 4.1 10*3/uL (ref 4.0–10.5)
nRBC: 0 % (ref 0.0–0.2)

## 2021-03-03 LAB — I-STAT BETA HCG BLOOD, ED (MC, WL, AP ONLY): I-stat hCG, quantitative: 6.6 m[IU]/mL — ABNORMAL HIGH (ref <5)

## 2021-03-03 LAB — TROPONIN I (HIGH SENSITIVITY): Troponin I (High Sensitivity): 5 ng/L (ref <18)

## 2021-03-03 MED ORDER — MECLIZINE HCL 25 MG PO TABS: 25.0000 mg | ORAL_TABLET | Freq: Three times a day (TID) | ORAL | 0 refills | Status: DC | PRN

## 2021-03-03 NOTE — ED Provider Notes (Signed)
Emergency Department Provider Note   I have reviewed the triage vital signs and the nursing notes.   HISTORY  Chief Complaint Dizziness, Chest Pain, and Fatigue   HPI Maria [name] is a 46 y.o. female past medical history reviewed below including elevated blood pressure presents to the emergency department with vertigo this morning.  Patient awoke and was feeling well but suddenly felt like the room was moving/shifting.  This lasted for several minutes and then spontaneously resolved.  No tinnitus.  She became very concerned during this event and developed some chest tightness and felt somewhat short of breath.  Denies any vomiting.  She has had some mild vertigo in the past but nothing as severe as this. No numbness/weakness.  Her chest discomfort has since resolved.  She states that overall she is feeling well at this time.     Past Medical History:  Diagnosis Date   History of UTI    Hypertension     Review of Systems  Constitutional: No fever/chills Eyes: No visual changes. ENT: No sore throat. Positive vertigo.  Cardiovascular: Positive chest pain. Respiratory: Positive shortness of breath. Gastrointestinal: No abdominal pain. Mild nausea, no vomiting.  No diarrhea.  No constipation. Genitourinary: Negative for dysuria. Musculoskeletal: Negative for back pain. Skin: Negative for rash. Neurological: Negative for headaches, focal weakness or numbness.   ____________________________________________   PHYSICAL EXAM:  VITAL SIGNS: ED Triage Vitals  Enc Vitals Group     BP 03/03/21 0806 (!) 156/95     Pulse Rate 03/03/21 0806 92     Resp 03/03/21 0806 16     Temp 03/03/21 0806 98.4 F (36.9 C)     Temp Source 03/03/21 0806 Oral     SpO2 03/03/21 0806 98 %   Constitutional: Alert and oriented. Well appearing and in no acute distress. Eyes: Conjunctivae are normal.  Head: Atraumatic. Nose: No congestion/rhinnorhea. Mouth/Throat: Mucous membranes are  moist.   Neck: No stridor.  Cardiovascular: Normal rate, regular rhythm. Good peripheral circulation. Grossly normal heart sounds.   Respiratory: Normal respiratory effort.  No retractions. Lungs CTAB. Gastrointestinal: Soft and nontender. No distention.  Musculoskeletal: No lower extremity tenderness nor edema. No gross deformities of extremities.  Neurologic:  Normal speech and language. No gross focal neurologic deficits are appreciated. Normal gait.  Skin:  Skin is warm, dry and intact. No rash noted.  ____________________________________________   LABS (all labs ordered are listed, but only abnormal results are displayed)  Labs Reviewed  BASIC METABOLIC PANEL - Abnormal; Notable for the following components:      Result Value   Glucose, Bld 104 (*)    Creatinine, Ser 1.09 (*)    All other components within normal limits  I-STAT BETA HCG BLOOD, ED ([facility], [facility], [facility] ONLY) - Abnormal; Notable for the following components:   I-stat hCG, quantitative 6.6 (*)    All other components within normal limits  CBC  TROPONIN I (HIGH SENSITIVITY)   ____________________________________________  EKG   EKG Interpretation  Date/Time:  Tuesday March 03 2021 08:08:52 EST Ventricular Rate:  96 PR Interval:  142 QRS Duration: 74 QT Interval:  346 QTC Calculation: 437 R Axis:   76 Text Interpretation: Normal sinus rhythm Normal ECG When compared with ECG of 10-Dec-2020 00:39, PREVIOUS ECG IS PRESENT Confirmed by Nanda Quinton [phone]) on 03/03/2021 11:05:37 AM        ____________________________________________  RADIOLOGY  DG Chest 2 View  Result Date: 03/03/2021 CLINICAL DATA:  Chest pain, dizziness  EXAM: CHEST - 2 VIEW COMPARISON:  None. FINDINGS: The heart size and mediastinal contours are within normal limits. Both lungs are clear. The visualized skeletal structures are unremarkable. IMPRESSION: No acute abnormality of the lungs. Electronically Signed   By: Delanna Ahmadi M.D.   On:  03/03/2021 12:02    ____________________________________________   PROCEDURES  Procedure(s) performed:   Procedures  None  ____________________________________________   INITIAL IMPRESSION / ASSESSMENT AND PLAN / ED COURSE  Pertinent labs & imaging results that were available during my care of the patient were reviewed by me and considered in my medical decision making (see chart for details).   This patient is Presenting for Evaluation of vertigo and CP, which does require a range of treatment options, and is a complaint that involves a high risk of morbidity and mortality.  The Differential Diagnoses includes all life-threatening causes for chest pain. This includes but is not exclusive to acute coronary syndrome, aortic dissection, pulmonary embolism, cardiac tamponade, community-acquired pneumonia, pericarditis, musculoskeletal chest wall pain, etc.   Critical Interventions- evaluation and lab/EKG interpretation with ordering for troponin.    Reassessment after intervention: Patient remains well appearing with normal vitals.    I decided to review pertinent External Data, and in summary patient with PCP visit in 12/31/20. CP noted at that time. Reassuring outpatient w/u. No Cardiology referral advised at that time.    Clinical Laboratory Tests Ordered, included basic metabolic panel with CBC.  Normal creatinine.  No leukocytosis or anemia.  Platelet count is within normal limits.   Radiologic Tests Ordered, included CXR which I independently interpreted and WNL. No infiltrate or pulmonary edema.   Social Determinants of Health Risk good PCP follow up.   Reevaluation with update and discussion with patient. Symptoms are improved here. Ambulatory without symptoms. No neuro deficits.   Medical Decision Making: Summary:  Patient awoke this morning with vertigo which seem to prompt her other associated symptoms.  The episode was brief.  Her neurologic exam here is normal.  My  suspicion for central process causing her vertigo such as posterior circulation stroke is low.  Her chest discomfort has also resolved.  During the MSE process troponin and chest x-ray were not ordered.  I have added this on here.  Her EKG is interpreted by me as above and overall reassuring.   Disposition: discharge  ____________________________________________  FINAL CLINICAL IMPRESSION(S) / ED DIAGNOSES  Final diagnoses:  Precordial pain  Vertigo    NEW OUTPATIENT MEDICATIONS STARTED DURING THIS VISIT:  Discharge Medication List as of 03/03/2021  1:44 PM     START taking these medications   Details  meclizine (ANTIVERT) 25 MG tablet Take 1 tablet (25 mg total) by mouth 3 (three) times daily as needed for dizziness., Starting Tue 03/03/2021, Normal        Note:  This document was prepared using Dragon voice recognition software and may include unintentional dictation errors.  Nanda Quinton, MD, [facility]    Sarim Rothman, [facility], MD 03/04/21 [phone]

## 2021-03-03 NOTE — ED Notes (Signed)
Pt ambulatory to waiting room. Pt verbalized understanding of discharge instructions.   

## 2021-03-03 NOTE — Telephone Encounter (Signed)
Per chart review tab pt is still at [facility] ED. Sending note to Gentry Fitz NP and [facility] CMA.

## 2021-03-03 NOTE — Telephone Encounter (Signed)
Welcome RECORD [facility] Patient Name: Maria [name] [facility] Gender: Female DOB: [birth year] Age: 46 Y 2 M 6 D Return Phone Number: [phone] (Primary) Address: City/ State/ Zip: [place]  [address] Client [place] [facility] - Client Client Site [place] - Day Contact Type Call Who Is Calling Patient / Member / Family / Caregiver Call Type Triage / Clinical Relationship To Patient Self Return Phone Number [phone] (Primary) Chief Complaint CHEST PAIN - pain, pressure, heaviness or tightness Reason for Call Symptomatic / Request for Health Information Initial Comment Call being transferred from office caller is already at the the hospital with chest tight with dizziness and fatigue, Caller is at the hospital but there is a 12hr wait Translation No Nurse Assessment Nurse: Doyle Askew, RN, Beth Date/Time (Eastern Time): 03/03/2021 9:59:53 AM Confirm and document reason for call. If symptomatic, describe symptoms. ---Call being transferred from office Caller is already at the the hospital with chest tight with dizziness and fatigue, Caller is at the hospital but there is a 12hr wait Does the patient have any new or worsening symptoms? ---Yes Will a triage be completed? ---Yes Related visit to physician within the last 2 weeks? ---No Does the PT have any chronic conditions? (i.e. diabetes, asthma, this includes High risk factors for pregnancy, etc.) ---Yes List chronic conditions. ---HTN Is the patient pregnant or possibly pregnant? (Ask all females between the ages of 57-55) ---No Is this a behavioral health or substance abuse call? ---No Guidelines Guideline Title Affirmed Question Affirmed Notes Nurse Date/Time Eilene Ghazi Time) Chest Pain Dizziness or lightheadedness Doyle Askew, RN, [facility] 03/03/2021 10:01:57 AM Disp. Time Eilene Ghazi Time) Disposition Final  User 03/03/2021 9:57:46 AM Send to Urgent Daron Offer, Lanette PLEASE NOTE: All timestamps contained within this report are represented as [place] Standard Time. CONFIDENTIALTY NOTICE: This fax transmission is intended only for the addressee. It contains information that is legally privileged, confidential or otherwise protected from use or disclosure. If you are not the intended recipient, you are strictly prohibited from reviewing, disclosing, copying using or disseminating any of this information or taking any action in reliance on or regarding this information. If you have received this fax in error, please notify us immediately by telephone so that we can arrange for its return to [place]. Phone: [phone], Toll-Free: [phone], Fax: [phone] Page: 2 of 2 Call Id: [id number] 03/03/2021 10:03:58 AM Go to ED Now Yes Doyle Askew, RN, Beth Caller Disagree/Comply Comply Caller Understands Yes PreDisposition Go to ED Care Advice Given Per Guideline * You need to be seen in the Emergency Department. GO TO ED NOW: * Go to the ED at ___________ [facility] IF: * Severe difficulty breathing occurs * Passes out or becomes too weak to stand * You become worse CARE ADVICE given per Chest Pain (Adult) guideline. Referrals [place]

## 2021-03-03 NOTE — ED Triage Notes (Signed)
Pt. Stated, I woke up this morning and felt swimmy headed and had some chest tightness.

## 2021-03-03 NOTE — Telephone Encounter (Signed)
Noted, will await notes. ° °

## 2021-03-03 NOTE — Discharge Instructions (Signed)

## 2021-03-09 ENCOUNTER — Telehealth: Payer: Self-pay

## 2021-03-09 NOTE — Telephone Encounter (Addendum)
Please thank her for the update. We discussed this during her visit in November.  Does she want the same conditions for her FMLA as provided last time? Will also print out her old FMLA and a blank copy?

## 2021-03-09 NOTE — Telephone Encounter (Signed)
Patient called cancelled her appointment for tomorrow. She also requested that her FMLA be extended for 6 months.

## 2021-03-10 ENCOUNTER — Ambulatory Visit: Payer: Self-pay | Admitting: Primary Care

## 2021-03-10 NOTE — Telephone Encounter (Signed)
Will keep eye out for form.

## 2021-03-10 NOTE — Telephone Encounter (Signed)
Pt called in. She will bring a blank form to [place] office.

## 2021-03-10 NOTE — Telephone Encounter (Signed)
Left message to return call to our office.  Have ppw printed from last set. Need to get patient to send [place] a blank copy to fill out to our office.

## 2021-03-13 ENCOUNTER — Telehealth: Payer: Self-pay | Admitting: *Deleted

## 2021-03-13 NOTE — Telephone Encounter (Signed)
Pt. Was seen in ED on 03/03/21 for chest pain please review chart and advise on how to proceed for [place].  PRE-VISIT SCHEDULED 03/31/21  PROCEDURE SCHEDULED 04/14/21,SHE IS A DIRECT

## 2021-03-13 NOTE — Telephone Encounter (Signed)
PT. ALSO HAS CARDIOLOGY APPT. 03/20/21

## 2021-03-13 NOTE — Telephone Encounter (Signed)
Thanks for letting me know. Her procedure in February should be cancelled and can be rescheduled once she has completed her cardiac evaluation and cleared by them to undergo a procedure. May be better to schedule her a clinic visit for reassessment and discuss her history, as further, she has been seen by another GI practice in 2021 for IDA and never followed up with that evaluation that I can see. Thanks

## 2021-03-16 NOTE — Telephone Encounter (Signed)
Left message with number to call to discuus upcoming procedure.

## 2021-03-16 NOTE — Telephone Encounter (Signed)
Pt. Scheduled to see Dr. Havery Moros 04/28/21 @ 0830 prior to scheduling procedure.

## 2021-03-19 NOTE — Telephone Encounter (Signed)
Maria [name] called in and stated that she is waiting for her FMLA paperwork. She dropped off the ppw on last Thursday

## 2021-03-20 ENCOUNTER — Ambulatory Visit: Payer: 59 | Admitting: Internal Medicine

## 2021-03-24 NOTE — Telephone Encounter (Signed)
Called patient l/m to call office. Form has been faxed to number provided on past forms

## 2021-03-24 NOTE — Telephone Encounter (Signed)
Called patient left message to let her know that paperwork was faxed and if she would like copy or has any questions to give our office a call

## 2021-03-30 ENCOUNTER — Ambulatory Visit: Payer: 59 | Admitting: Professional

## 2021-04-06 ENCOUNTER — Other Ambulatory Visit: Payer: Self-pay

## 2021-04-06 ENCOUNTER — Ambulatory Visit (INDEPENDENT_AMBULATORY_CARE_PROVIDER_SITE_OTHER): Payer: 59 | Admitting: Cardiology

## 2021-04-06 VITALS — BP 138/90 | HR 87 | Ht 68.0 in | Wt 192.8 lb

## 2021-04-06 DIAGNOSIS — I10 Essential (primary) hypertension: Secondary | ICD-10-CM | POA: Diagnosis not present

## 2021-04-06 DIAGNOSIS — R079 Chest pain, unspecified: Secondary | ICD-10-CM

## 2021-04-06 DIAGNOSIS — Z7189 Other specified counseling: Secondary | ICD-10-CM

## 2021-04-06 NOTE — Progress Notes (Incomplete)
Cardiology Office Note:    Date:  04/06/2021   ID:  Maria, [name] Jul 13, 1975, MRN [id number]  PCP:  Pleas Koch, NP  Cardiologist:  Buford Dresser, MD  Referring MD: Margette Fast, MD   No chief complaint on file.   History of Present Illness:    Maria [name] is a 46 y.o. female with a hx of hypertension, who is seen as a new consult at the request of Long, Wonda Olds, MD for the evaluation and management of chest pain.  Chest pain: -Initial onset: "A couple months." CP noted at 12/31/20 visit with PCP. -Quality: Heaviness, pressure -Frequency: "Not too often." -Duration: -Associated symptoms: Dizziness and fatigue (With ED visit only). -Aggravating/alleviating factors: None -Prior cardiac history: None -Prior ECG: 03/03/21-NSR, rate 96. -Prior workup: Presented to ED 03/03/2021. She woke up and suddenly felt room-spinning dizziness for several minutes. She then developed some chest tightness and shortness of breath. Neurologic exam normal, labs and CXR reassuring. Of note, she endorses previous episodes of vertigo, but nothing like she had experienced on 03/03/21. Also, she is unsure if the chest tightness she felt on that day was related to her heart or due to anxiety. -Prior treatment: Started on meclizine 03/03/21. -Alcohol: None. -Tobacco: None. -Comorbidities: Hypertension - 4-5 years, on meds 1-2 years. Currently owns a wrist cuff but plans to replace this. Hyperlipidemia. -Exercise level: Walks frequently in the warmer weather. Uses her Peloton bike regularly. She denies feeling any physical limitations. -Current diet: If she drinks coffee it will be about 1/2 cup with sugar. -Cardiac ROS: no shortness of breath, no PND, no orthopnea, no LE edema, no syncope -Family history: No known cardiovascular history on maternal side, unsure on her paternal side.  Overall, she is not sure if she is overly anxious about her heart health. Lately she has been  feeling palpitations mostly at night, which she describes as her heart "beating out of her chest". When checking her resting heart rate she typically sees readings around 104 bpm.  Of note, she had a recent COVID-19 infection in 01/2021.  She denies any shortness of breath, or peripheral edema. No headaches, syncope, orthopnea, or PND.  Past Medical History:  Diagnosis Date   Abnormal TSH    Chest tightness    Dizziness    History of UTI    Hypertension    Palpitations    Precordial pain     Past Surgical History:  Procedure Laterality Date   CATARACT EXTRACTION Left    2021    Current Medications: Current Outpatient Medications on File Prior to Visit  Medication Sig   hydrochlorothiazide (HYDRODIURIL) 12.5 MG tablet Take 1 tablet (12.5 mg total) by mouth daily. For blood pressure.   ergocalciferol (VITAMIN D2) 1.25 MG (50000 UT) capsule Take 1 capsule by mouth once a week. (Patient not taking: Reported on 04/06/2021)   fluticasone (FLONASE) 50 MCG/ACT nasal spray Place 1 spray into both nostrils 2 (two) times daily. (Patient not taking: Reported on 12/31/2020)   meclizine (ANTIVERT) 25 MG tablet Take 1 tablet (25 mg total) by mouth 3 (three) times daily as needed for dizziness. (Patient not taking: Reported on 04/06/2021)   No current facility-administered medications on file prior to visit.     Allergies:   Patient has no known allergies.   Social History   Tobacco Use   Smoking status: Never   Smokeless tobacco: Never  Vaping Use   Vaping Use: Never used  Substance Use  Topics   Alcohol use: No   Drug use: No    Family History: family history includes Arthritis in her maternal grandmother; Birth defects in her maternal grandfather; Diabetes in her maternal grandmother; Hypertension in her maternal grandmother; Throat cancer in her maternal grandfather. There is no history of Breast cancer.  ROS:   Please see the history of present illness.  Additional pertinent  ROS: Constitutional: Negative for chills, fever, night sweats, unintentional weight loss  HENT: Negative for ear pain and hearing loss.   Eyes: Negative for loss of vision and eye pain.  Respiratory: Negative for cough, sputum, wheezing.   Cardiovascular: See HPI. Gastrointestinal: Negative for abdominal pain, melena, and hematochezia.  Genitourinary: Negative for dysuria and hematuria.  Musculoskeletal: Negative for falls and myalgias.  Skin: Negative for itching and rash.  Neurological: Negative for focal weakness, focal sensory changes and loss of consciousness.  Endo/Heme/Allergies: Does not bruise/bleed easily.     EKGs/Labs/Other Studies Reviewed:    The following studies were reviewed today:  CXR 03/03/2021: COMPARISON:  None.   FINDINGS: The heart size and mediastinal contours are within normal limits. Both lungs are clear. The visualized skeletal structures are unremarkable.   IMPRESSION: No acute abnormality of the lungs.  EKG:  EKG is personally reviewed.   04/06/2021: EKG was not ordered.   Recent Labs: 12/31/2020: ALT 13; TSH 0.65 03/03/2021: BUN 12; Creatinine, Ser 1.09; Hemoglobin 12.6; Platelets 245; Potassium 3.5; Sodium 140   Recent Lipid Panel    Component Value Date/Time   CHOL 207 (H) 12/31/2020 0844   CHOL 180 12/13/2016 1156   TRIG 75.0 12/31/2020 0844   HDL 53.00 12/31/2020 0844   HDL 56 12/13/2016 1156   CHOLHDL 4 12/31/2020 0844   VLDL 15.0 12/31/2020 0844   LDLCALC 139 (H) 12/31/2020 0844   LDLCALC 108 (H) 12/13/2016 1156    Physical Exam:    VS:  BP 138/90 (BP Location: Right Arm, Patient Position: Sitting, Cuff Size: Large)    Pulse 87    Ht 5\' 8"  (1.727 m)    Wt 192 lb 12.8 oz (87.5 kg)    BMI 29.32 kg/m     Wt Readings from Last 3 Encounters:  04/06/21 192 lb 12.8 oz (87.5 kg)  12/31/20 195 lb (88.5 kg)  12/10/20 192 lb (87.1 kg)    GEN: Well nourished, well developed in no acute distress HEENT: Normal, moist mucous  membranes NECK: No JVD CARDIAC: regular rhythm, normal S1 and S2, no rubs or gallops. No murmur. VASCULAR: Radial and DP pulses 2+ bilaterally. No carotid bruits RESPIRATORY:  Clear to auscultation without rales, wheezing or rhonchi  ABDOMEN: Soft, non-tender, non-distended MUSCULOSKELETAL:  Ambulates independently SKIN: Warm and dry, no edema NEUROLOGIC:  Alert and oriented x 3. No focal neuro deficits noted. PSYCHIATRIC:  Normal affect    ASSESSMENT:    1. Chest pain of uncertain etiology   2. Essential hypertension   3. Cardiac risk counseling   4. Counseling on health promotion and disease prevention    PLAN:     Cardiac risk counseling and prevention recommendations: -recommend heart healthy/Mediterranean diet, with whole grains, fruits, vegetable, fish, lean meats, nuts, and olive oil. Limit salt. -recommend moderate walking, 3-5 times/week for 30-50 minutes each session. Aim for at least 150 minutes.week. Goal should be pace of 3 miles/hours, or walking 1.5 miles in 30 minutes -recommend avoidance of tobacco products. Avoid excess alcohol. -ASCVD risk score: The 10-year ASCVD risk score (Arnett DK, et al.,  2019) is: 4.2%   Values used to calculate the score:     Age: 57 years     Sex: Female     Is Non-Hispanic African American: Yes     Diabetic: No     Tobacco smoker: No     Systolic Blood Pressure: 782 mmHg     Is BP treated: Yes     HDL Cholesterol: 53 mg/dL     Total Cholesterol: 207 mg/dL    Plan for follow up: PRN or sooner as needed if Ca score abnormal.  Buford Dresser, MD, PhD, [facility]    Medication Adjustments/Labs and Tests Ordered: Current medicines are reviewed at length with the patient today.  Concerns regarding medicines are outlined above.   No orders of the defined types were placed in this encounter.  No orders of the defined types were placed in this encounter.  There are no Patient Instructions on file for  this visit.   I,Mathew Stumpf,acting as a Education administrator for [employer], MD.,have documented all relevant documentation on the behalf of Buford Dresser, MD,as directed by  Buford Dresser, MD while in the presence of Buford Dresser, MD.  ***  Signed, Buford Dresser, MD PhD 04/06/2021 9:19 AM    [place]

## 2021-04-06 NOTE — Patient Instructions (Signed)
Medication Instructions:  Your Physician recommend you continue on your current medication as directed.    *If you need a refill on your cardiac medications before your next appointment, please call your pharmacy*   Lab Work: None ordered today   Testing/Procedures: Your physician has recommend you to have a coronary calcium score. This is a self pay test that will cost $99   Follow-Up: At [facility], you and your health needs are our priority.  As part of our continuing mission to provide you with exceptional heart care, we have created designated Provider Care Teams.  These Care Teams include your primary Cardiologist (physician) and Advanced Practice Providers (APPs -  Physician Assistants and Nurse Practitioners) who all work together to provide you with the care you need, when you need it.  We recommend signing up for the patient portal called "MyChart".  Sign up information is provided on this After Visit Summary.  MyChart is used to connect with patients for Virtual Visits (Telemedicine).  Patients are able to view lab/test results, encounter notes, upcoming appointments, etc.  Non-urgent messages can be sent to your provider as well.   To learn more about what you can do with MyChart, go to [email or web address].    Your next appointment:   Call us if you need [place]!   Other Instructions None today!

## 2021-04-14 ENCOUNTER — Encounter: Payer: Self-pay | Admitting: Gastroenterology

## 2021-04-28 ENCOUNTER — Telehealth: Payer: Self-pay | Admitting: Primary Care

## 2021-04-28 ENCOUNTER — Ambulatory Visit: Payer: 59 | Admitting: Gastroenterology

## 2021-04-28 NOTE — Telephone Encounter (Signed)
This pt walked in to office around 1:20 pm on 04/28/21 without an appt; When I got back from lunch I triaged pt.about 1:30 pm. Pt said for 2 days she had a dull throbbing pressure like h/a on rt side of head at hairline of forehead. Now H/A pain level  2 . Pt said she felt tired also. Pt said she could not take her BP at home due to BP cuff not working. Pt has no dizziness, CP,SOB, vision changes or swelling. Pt said advil not helping h/a.pt has been takig HCTZ 12.5 mg one daily for BP and has not missed med. Pt said she had been out of the Vit D 50,000 UTcaps that she was taking once weekly for about 2 months. Pt said in no pain and nothing to excite or stress pt. Pt said had interview this morning around 10 AM and was excited then but is not excited now.. pt has been sitting for approx 5 - 6 mins. Checked BP sitting lt arm reg cuff; BP 160/98. T 97.0 P 88 pulse ox room air 97%. No covid symptoms or exposure per pt. Pt does  not appear in distress, sitting quietly on phone. Allie Bossier NP said to schedule pt with Anda Kraft on 04/30/21 at 11:40 and if pt could get new BP cuff to monitor BP. Pt notified of Clearence Cheek advice and pt voiced understanding and scheduled appt with kate on 04/30/21 at 11:40. UC & ED precautions given and pt voiced understanding. Sending note to Gentry Fitz NP and [facility] CMA. Pt wanted note stating that she had been at office today and could return to work. Letter in pt chart under letter tab. Pt said if any one from her work called was not to release any medical information on pt. Front personal notified. ?

## 2021-04-28 NOTE — Telephone Encounter (Addendum)
Noted and appreciate the assessment.  ?Maria [name], this encounter cannot be signed because you have the note pending.  Will you please sign? ?

## 2021-04-28 NOTE — Telephone Encounter (Signed)
error 

## 2021-04-30 ENCOUNTER — Ambulatory Visit: Payer: 59 | Admitting: Primary Care

## 2021-05-05 ENCOUNTER — Ambulatory Visit: Payer: 59 | Admitting: Professional

## 2021-05-15 ENCOUNTER — Inpatient Hospital Stay: Admission: RE | Admit: 2021-05-15 | Payer: 59 | Source: Ambulatory Visit

## 2021-05-17 ENCOUNTER — Encounter (HOSPITAL_BASED_OUTPATIENT_CLINIC_OR_DEPARTMENT_OTHER): Payer: Self-pay | Admitting: Cardiology

## 2021-06-05 ENCOUNTER — Encounter: Payer: Self-pay | Admitting: Gastroenterology

## 2021-06-05 ENCOUNTER — Other Ambulatory Visit (INDEPENDENT_AMBULATORY_CARE_PROVIDER_SITE_OTHER): Payer: 59

## 2021-06-05 ENCOUNTER — Ambulatory Visit (INDEPENDENT_AMBULATORY_CARE_PROVIDER_SITE_OTHER): Payer: 59 | Admitting: Gastroenterology

## 2021-06-05 VITALS — BP 126/88 | HR 86 | Ht 68.0 in | Wt 190.0 lb

## 2021-06-05 DIAGNOSIS — Z1211 Encounter for screening for malignant neoplasm of colon: Secondary | ICD-10-CM

## 2021-06-05 DIAGNOSIS — D509 Iron deficiency anemia, unspecified: Secondary | ICD-10-CM

## 2021-06-05 DIAGNOSIS — Z87898 Personal history of other specified conditions: Secondary | ICD-10-CM | POA: Diagnosis not present

## 2021-06-05 LAB — IBC + FERRITIN
Ferritin: 31.3 ng/mL (ref 10.0–291.0)
Iron: 72 ug/dL (ref 42–145)
Saturation Ratios: 23.5 % (ref 20.0–50.0)
TIBC: 306.6 ug/dL (ref 250.0–450.0)
Transferrin: 219 mg/dL (ref 212.0–360.0)

## 2021-06-05 MED ORDER — SUTAB 1479-225-188 MG PO TABS: 1.0000 | ORAL_TABLET | Freq: Once | ORAL | 0 refills | Status: AC

## 2021-06-05 NOTE — Patient Instructions (Addendum)
If you are age 48 or older, your body mass index should be between 23-30. Your Body mass index is 28.89 kg/m?Marland Kitchen If this is out of the aforementioned range listed, please consider follow up with your Primary Care Provider. ? ?If you are age 103 or younger, your body mass index should be between 19-25. Your Body mass index is 28.89 kg/m?Marland Kitchen If this is out of the aformentioned range listed, please consider follow up with your Primary Care Provider.  ? ?________________________________________________________ ? ?The [facility] providers would like to encourage you to use [facility] to communicate with providers for non-urgent requests or questions.  Due to long hold times on the telephone, sending your provider a message by [facility] may be a faster and more efficient way to get a response.  Please allow 48 business hours for a response.  Please remember that this is for non-urgent requests.  ?_______________________________________________________ ? ?You have been scheduled for an EGD/ colonoscopy. Please follow written instructions given to you at your visit today.  ?Please pick up your prep supplies at the pharmacy within the next 1-3 days. ?If you use inhalers (even only as needed), please bring them with you on the day of your procedure. ? ?Please go to the lab in the basement of our building to have lab work done as you leave today. Hit "B" for basement when you get on the elevator.  When the doors open the lab is on your left.  We will call you with the results. Thank you. ? ?Thank you for entrusting me with your care and for choosing [employer], ?Dr. Clifton Forge Cellar ? ? ? ?

## 2021-06-05 NOTE — Progress Notes (Signed)
? ?HPI :  ?46 year old female with history of iron deficiency, chest pain, hypertension, referred by Alma Friendly, NP for work-up of iron deficiency and screening colonoscopy. ? ?Looking through the patient's record and discussing with her, she has a history of iron deficiency dating back to 2018 from what I can see.  At that point in time she had a microcytic anemia with a hemoglobin of eights to nines with low ferritin levels as well as 3.  She had low iron levels through 2021 or so, but her hemoglobin improved over time.  She states she was given iron supplementation to take but caused her to have upset stomach so she did not take it much.  At the time when she was anemic she states she was having very heavy menstrual cycles.  She has since become perimenopausal in recent years and her menses are infrequent.  Over time her anemia has resolved, her hemoglobin was 12.6 with MCV of 90 in January. ? ?She denies ever seeing blood in her stools.  She states her bowel habits are regular.  She denies any problems with eating.  No nausea or vomiting, no reflux symptoms.  She occasionally has to clear her throat after eating, but no overt regurgitation or pyrosis.  No postprandial abdominal pain.  No abdominal pain in general on a frequent basis, occasionally has lower abdominal pain on the right side that she thinks might be musculoskeletal, only occurs while sitting in a chair at work and outside of this situation she does not have pain.  She states it is mild, ongoing for the past year.  She denies any routine NSAID use, perhaps takes Advil PM once a week.  She takes vitamin D and B12 supplement over-the-counter otherwise. ? ?She has never had an EGD or colonoscopy.  No family history of GI tract malignancies.  No tobacco use. ? ?The patient was seen in the emergency department with chest pain back in January.  She was referred to cardiology, saw Dr. Harrell Gave in February.  She was referred for cardiac CT to make  sure everything is okay although symptoms not thought to be cardiac in etiology.  Patient states she thought her symptoms were due to anxiety at the time.  She has not had any recurrence of chest pain and denies any cardiopulmonary symptoms currently. ? ?Past Medical History:  ?Diagnosis Date  ? Abnormal TSH   ? Chest tightness   ? Dizziness   ? History of UTI   ? Hypertension   ? Palpitations   ? Precordial pain   ? ? ? ?Past Surgical History:  ?Procedure Laterality Date  ? CATARACT EXTRACTION Left   ? 2021  ? ?Family History  ?Problem Relation Age of Onset  ? Arthritis Maternal Grandmother   ? Diabetes Maternal Grandmother   ? Hypertension Maternal Grandmother   ? Throat cancer Maternal Grandfather   ? Birth defects Maternal Grandfather   ? Breast cancer Neg Hx   ? Colon cancer Neg Hx   ? Stomach cancer Neg Hx   ? ?Social History  ? ?Tobacco Use  ? Smoking status: Never  ? Smokeless tobacco: Never  ?Vaping Use  ? Vaping Use: Never used  ?Substance Use Topics  ? Alcohol use: No  ? Drug use: No  ? ?Current Outpatient Medications  ?Medication Sig Dispense Refill  ? ergocalciferol (VITAMIN D2) 1.25 MG (50000 UT) capsule Take 1 capsule by mouth once a week.    ? hydrochlorothiazide (HYDRODIURIL) 12.5 MG tablet  Take 1 tablet (12.5 mg total) by mouth daily. For blood pressure. 90 tablet 3  ? ?No current facility-administered medications for this visit.  ? ?No Known Allergies ? ? ?Review of Systems: ?All systems reviewed and negative except where noted in HPI.  ? ? ?Lab Results  ?Component Value Date  ? WBC 4.1 03/03/2021  ? HGB 12.6 03/03/2021  ? HCT 39.4 03/03/2021  ? MCV 90.8 03/03/2021  ? PLT 245 03/03/2021  ? ? ? ?  Latest Ref Rng & Units 03/03/2021  ?  8:14 AM 12/31/2020  ?  8:44 AM 12/10/2020  ?  1:14 AM  ?CBC  ?WBC 4.0 - 10.5 K/uL 4.1   4.0   4.9    ?Hemoglobin 12.0 - 15.0 g/dL 12.6   13.5   13.4    ?Hematocrit 36.0 - 46.0 % 39.4   40.7   40.6    ?Platelets 150 - 400 K/uL 245   188.0   187    ? ? ?Lab Results   ?Component Value Date  ? IRON 90 08/15/2020  ? TIBC 333 05/31/2019  ? FERRITIN 25.8 08/15/2020  ? ? ?Lab Results  ?Component Value Date  ? ALT 13 12/31/2020  ? AST 14 12/31/2020  ? ALKPHOS 70 12/31/2020  ? BILITOT 0.5 12/31/2020  ? ? ? ?Physical Exam: ?BP 126/88   Pulse 86   Ht '5\' 8"'$  (1.727 m)   Wt 190 lb (86.2 kg)   BMI 28.89 kg/m?  ?Constitutional: Pleasant,well-developed, female in no acute distress. ?HEENT: Normocephalic and atraumatic. Conjunctivae are normal. No scleral icterus. ?Neck supple.  ?Cardiovascular: Normal rate, regular rhythm.  ?Pulmonary/chest: Effort normal and breath sounds normal.  ?Abdominal: Soft, nondistended, nontender. There are no masses palpable.  ?Extremities: no edema ?Lymphadenopathy: No cervical adenopathy noted. ?Neurological: Alert and oriented to person place and time. ?Skin: Skin is warm and dry. No rashes noted. ?Psychiatric: Normal mood and affect. Behavior is normal. ? ? ?ASSESSMENT AND PLAN: ?46 year old female here for new patient assessment of the following: ? ?Iron deficiency anemia ?Colon cancer screening ?History of chest pain ? ?As above, patient with microcytic anemia with hemoglobin of eights to nines with severe iron deficiency dating back to 2018 on review of the chart.  This was thought to be due to heavy menses at the time.  She has since become perimenopausal, menses are infrequent and her anemia has resolved without any iron supplementation.  Her MCV is currently in the 90s.  We discussed iron deficiency in general and potential etiologies.  Again it sounds very likely that this was due to her menstrual cycle however given her age, endoscopic evaluation is recommended to ensure no GI tract etiology, especially as she has not had a colonoscopy before and is overdue for screening.  I discussed EGD and colonoscopy with her, risks and benefits of the exams and anesthesia, she wants to proceed.  She is otherwise asymptomatic at this time.  I will recheck her  iron studies to make sure normal, suspect they are given her MCV. ? ?She wants to proceed with these exams, however given her history of chest pain, we will await her cardiac CT to be done first to ensure okay.  She denies any cardiopulmonary symptoms currently, she thinks her chest pain was due to anxiety at the time, will await that test first we will proceed with endoscopic evaluation.  She agrees with plan as outlined. ? ?Plan: ?- lab for TIBC / ferritin ?- schedule for EGD /  colonoscopy   ?- awaiting cardiac CT to be completed prior to her endoscopic procedures ? ?Jolly Mango, MD ?[facility] ?  ?CC: ?Pleas Koch, NP ? ?

## 2021-06-09 ENCOUNTER — Inpatient Hospital Stay: Admission: RE | Admit: 2021-06-09 | Payer: Self-pay | Source: Ambulatory Visit

## 2021-06-18 ENCOUNTER — Encounter: Payer: Self-pay | Admitting: Family Medicine

## 2021-06-18 ENCOUNTER — Ambulatory Visit (INDEPENDENT_AMBULATORY_CARE_PROVIDER_SITE_OTHER): Payer: 59 | Admitting: Family Medicine

## 2021-06-18 VITALS — BP 150/90 | HR 99 | Temp 98.6°F | Ht 68.0 in | Wt 195.2 lb

## 2021-06-18 DIAGNOSIS — B349 Viral infection, unspecified: Secondary | ICD-10-CM | POA: Diagnosis not present

## 2021-06-18 DIAGNOSIS — R051 Acute cough: Secondary | ICD-10-CM

## 2021-06-18 DIAGNOSIS — J029 Acute pharyngitis, unspecified: Secondary | ICD-10-CM | POA: Diagnosis not present

## 2021-06-18 LAB — POC COVID19 BINAXNOW: SARS Coronavirus 2 Ag: NEGATIVE

## 2021-06-18 LAB — POCT RAPID STREP A (OFFICE): Rapid Strep A Screen: NEGATIVE

## 2021-06-18 NOTE — Progress Notes (Signed)
? ? ?Cherrise Occhipinti T. Yamna Mackel, MD, [facility] ?Therapist, music at [facility] ?[address] ?[place], [address] ? ?Phone: [phone]  FAX: [phone] ? ?Maria [name] - 46 y.o. female  MRN [id number]  Date of Birth: 31-Jul-1975 ? ?Date: 06/18/2021  PCP: Pleas Koch, NP  Referral: Pleas Koch, NP ? ?Chief Complaint  ?Patient presents with  ? Sore Throat  ?  Symptoms started 5/1 ?No Covid test done  ? Cough  ? Ear Pain  ?  Left  ? ? ?This visit occurred during the SARS-CoV-2 public health emergency.  Safety protocols were in place, including screening questions prior to the visit, additional usage of staff PPE, and extensive cleaning of exam room while observing appropriate contact time as indicated for disinfecting solutions.  ? ?Subjective:  ? ?Maria [name] is a 46 y.o. very pleasant female patient with Body mass index is 29.69 kg/m?. who presents with the following: ? ?Virus ? ?This very nice young lady was in [place] over the weekend, and she was traveling with her son squeezed bolting, and starting on Monday she started develop some nasal congestion, rhinorrhea, and a very significant sore throat.  She also has some ear pain predominantly on the left. ? ?She also does have a cough, but she generally is sleeping okay.. ? ?She does not have any GI symptoms or other symptoms. ? ?Review of Systems is noted in the HPI, as appropriate ? ?Objective:  ? ?BP (!) 150/90   Pulse 99   Temp 98.6 ?F (37 ?C) (Oral)   Ht '5\' 8"'$  (1.727 m)   Wt 195 lb 4 oz (88.6 kg)   SpO2 98%   BMI 29.69 kg/m?  ? ? ?Gen: WDWN, NAD. Globally Non-toxic ?HEENT: Throat clear, w/o exudate, R TM clear, L TM - good landmarks, No fluid present. rhinnorhea.  MMM ?Frontal sinuses: NT ?Max sinuses: NT ?NECK: Anterior cervical  LAD is absent ?CV: RRR, No M/G/R, cap refill <2 sec ?PULM: Breathing comfortably in no respiratory distress. no wheezing, crackles, rhonchi  ? ?Laboratory and Imaging  Data: ?Results for orders placed or performed in visit on 06/18/21  ?[place] COVID-19  ?Result Value Ref Range  ? SARS Coronavirus 2 Ag Negative Negative  ?POCT rapid strep A  ?Result Value Ref Range  ? Rapid Strep A Screen Negative Negative  ?  ? ?Assessment and Plan:  ? ?  ICD-10-CM   ?1. Viral syndrome  B34.9   ?  ?2. Sore throat  J02.9 POCT rapid strep A  ?  ?3. Acute cough  R05.1 POC COVID-19  ?  ? ?Generalized viral syndrome.  Upper nasal congestion and cough.  Reassuring, no COVID.  I think that she can continue with some supportive care and this will resolve over time. ? ?No orders of the defined types were placed in this encounter. ? ?Medications Discontinued During This Encounter  ?Medication Reason  ? ergocalciferol (VITAMIN D2) 1.25 MG (50000 UT) capsule Completed Course  ? ?Orders Placed This Encounter  ?Procedures  ? [place] COVID-19  ? POCT rapid strep A  ? ? ?Follow-up: No follow-ups on file. ? ?[employer] One speech-to-text software was used for transcription in this dictation.  Possible transcriptional errors can occur using Editor, commissioning.  ? ?Signed, ? ?Murna Backer T. Abhiram Criado, MD ? ? ?Outpatient Encounter Medications as of 06/18/2021  ?Medication Sig  ? hydrochlorothiazide (HYDRODIURIL) 12.5 MG tablet Take 1 tablet (12.5 mg total) by mouth daily. For blood pressure.  ? [  DISCONTINUED] ergocalciferol (VITAMIN D2) 1.25 MG (50000 UT) capsule Take 1 capsule by mouth once a week.  ? ?No facility-administered encounter medications on file as of 06/18/2021.  ?  ?

## 2021-07-06 ENCOUNTER — Ambulatory Visit: Payer: Self-pay | Admitting: Dermatology

## 2021-07-07 ENCOUNTER — Ambulatory Visit (INDEPENDENT_AMBULATORY_CARE_PROVIDER_SITE_OTHER): Payer: 59 | Admitting: Professional

## 2021-07-07 ENCOUNTER — Encounter: Payer: Self-pay | Admitting: Professional

## 2021-07-07 DIAGNOSIS — F4322 Adjustment disorder with anxiety: Secondary | ICD-10-CM
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
## 2021-07-07 NOTE — Progress Notes (Signed)
° ° ° ° ° ° ° ° ° ° ° ° ° ° °  Lynell Kussman, LCMHC °

## 2021-07-16 ENCOUNTER — Encounter: Payer: Self-pay | Admitting: Professional

## 2021-07-16 ENCOUNTER — Ambulatory Visit: Admission: RE | Admit: 2021-07-16 | Payer: Self-pay | Source: Ambulatory Visit

## 2021-07-16 ENCOUNTER — Ambulatory Visit (INDEPENDENT_AMBULATORY_CARE_PROVIDER_SITE_OTHER): Payer: 59 | Admitting: Professional

## 2021-07-16 DIAGNOSIS — F4322 Adjustment disorder with anxiety: Secondary | ICD-10-CM

## 2021-07-16 NOTE — Progress Notes (Signed)
[facility] Counselor/Therapist Progress Note   Patient ID: Maria [name], MRN: [id number]     Date: 07/16/2021   Time Spent: 38 minutes 902- 940am   Treatment Type: Individual Therapy   Subjective: This session was held via video teletherapy. The patient consented to video teletherapy and was located at her home during this session. She is aware it is the responsibility of the patient to secure confidentiality on her end of the session. The provider was in a private home office for the duration of this session.    The patient arrived on time for her webex appointment.   Risk Assessment: Danger to Self:  No Self-injurious Behavior: No Danger to Others: No    Issues addressed: 1- treatment planning -patient and Clinician created patient's treatment plan -patient fully participated and is in agreement with her treatment plan.   Problems Addressed   Anxiety, Family Conflict, Financial Stress, Parenting  Goals  1. Achieve a greater level of family connectedness.  2. Achieve a level of competent, effective parenting.  Objective  Verbalize an understanding of the impact of their reaction on their child's behavior. Target Date: 2022-07-16 Frequency: Biweekly  Progress: 0 Modality: individual  Related Interventions  Assign the parents to implement key parenting practices consistently, including establishing realistic age-appropriate rules for acceptable and unacceptable behavior, prompting of positive behavior in the environment, use of positive reinforcement to encourage behavior (e.g., praise and clearly established rewards), use of calm clear direct instruction, time out, and other loss-of-privilege practices for problem behavior. Use a Parent Management Training approach beginning with teaching the parents how parent and child behavioral interactions can encourage or discourage positive or negative behavior and that changing key elements of those  interactions (e.g., prompting and reinforcing positive behaviors) can be used to promote positive change (e.g., Parenting the Strong-Willed Child by Martin Lake). Objective  Learn and implement parenting practices that have demonstrated effectiveness. Target Date: 2022-07-16 Frequency: Biweekly  Progress: 0 Modality: individual  Related Interventions  Teach the parents how to implement key parenting practices consistently, including establishing realistic age-appropriate rules for acceptable and unacceptable behavior, prompting of positive behavior in the environment, use of positive reinforcement to encourage behavior (e.g., praise), use of clear direct instruction, time out and other loss-of-privilege practices for problem behavior, negotiation, and renegotiation - usually with older children and adolescents (see Defiant Teens: A Clinician's Manual for Assessment and Family Intervention by Lindi Adie, and Shirlean Mylar; Defiant Children: A Clinician's Manual for Parent Training by Maryruth Eve). Assign the parents home exercises in which they implement parenting skills and record results of implementation (or assign "Using Reinforcement Principles in Parenting" in the Adult Psychotherapy Homework Planner by [facility]); review in session, providing corrective feedback toward improved, appropriate, and consistent use of skills. Objective  Verbalize a sense of increased skill, effectiveness, and confidence in parenting. Target Date: 2022-07-16 Frequency: Biweekly  Progress: 0 Modality: individual  Related Interventions  Support, empower, monitor, and encourage the parents in implementing new strategies for parenting their child; reinforce successes; problem-solve obstacles toward consolidating a coordinated, consistent, and effective parenting style. Objective  Older children and adolescents learn and implement skills for managing self and interactions with others. Target Date: 2022-07-16 Frequency:  Biweekly  Progress: 0 Modality: individual  Related Interventions  Use a Cognitive-Behavioral Therapy approach with older children and adolescents using several techniques such as instruction, modeling, role-playing, feedback, and practice to teach the child how to manage his/her emotional reactions, manage interpersonal interactions, and problem-solving conflicts. Objective  Develop skills to talk openly and effectively with the children. Target Date: 2022-07-16 Frequency: Biweekly  Progress: 0 Modality: individual  Related Interventions  Use instruction, modeling, and role-play to teach the parents how to communicate effectively with their child including use open-ended questions, active listening, and respectful assertive communication that encourage openness, sharing, and ongoing dialogue. Ask the parents to read material on parent-child communication (e.g., How to Talk So Kids Will Listen and Listen So Kids Will Talk by Larena Glassman; Parent Effectiveness Training by Araceli Bouche); help them implement the new communication style in daily dialogue with their children and to see the positive responses each child had to it. Objective  Learn and implement strategies to prevent relapse of disruptive behavior. Target Date: 2022-07-16 Frequency: Biweekly  Progress: 0 Modality: individual  Related Interventions  Instruct the parent/child to routinely use strategies learned in therapy (e.g., parent training techniques, problem-solving, anger management), building them into his/her life as much as possible. Develop a "coping card" or other recording on which coping strategies and other important information can be kept (e.g., steps in problem-solving, positive coping statements, reminders that were helpful to the client during therapy). Objective  Identify major concerns regarding the child's misbehavior and the associated parenting approaches that have been tried. Target Date: 2022-07-16  Frequency: Biweekly  Progress: 0 Modality: individual  Related Interventions  Using empathy and normalization of the parents' struggles, conduct a clinical interview focused on pinpointing the nature and severity of the child's misbehavior; assess parenting styles used to respond to the child's misbehavior, and what triggers and reinforcements may be contributing to the behavior. 3. Achieve a reasonable level of family connectedness and harmony where members support, help, and are concerned for each other.  4. Achieve an inner strength to control personal impulses, cravings, and desires that directly or indirectly increase debt irresponsibly.  5. Decrease the level of present conflict with parents while beginning to let go of or resolving past conflicts with them.  Objective  Identify own as well as others' role in the family conflicts. Target Date: 2022-07-16 Frequency: Biweekly  Progress: 0 Modality: individual  Related Interventions  Ask the client to read material on resolving family conflict (e.g., Making Peace with Your Parents by Wyona Almas); encourage and monitor the selection of concepts to begin using in conflict resolution. Confront the client when he/she is not taking responsibility for his/her role in the family conflict and reinforce the client for owning responsibility for his/her contribution to the conflict. Objective  Increase the number of positive family interactions by planning activities. Target Date: 2022-07-16 Frequency: Biweekly  Progress: 0 Modality: individual  Related Interventions  Assist the client in developing a list of positive family activities that promote harmony (e.g., bowling, fishing, playing table games, doing work projects). Schedule such activities into the family calendar. 6. Effectively manage challenging problem behavior of the child.  7. Enhance ability to effectively cope with the full variety of life's worries and  anxieties.  Objective  Describe situations, thoughts, feelings, and actions associated with anxieties and worries, their impact on functioning, and attempts to resolve them. Target Date: 2022-07-16 Frequency: Biweekly  Progress: 0 Modality: individual  Related Interventions  Ask the client to describe his/her past experiences of anxiety and their impact on functioning; assess the focus, excessiveness, and uncontrollability of the worry and the type, frequency, intensity, and duration of his/her anxiety symptoms (consider using a structured interview such as The Anxiety Disorders Interview Schedule-Adult Version). Objective  Verbalize an understanding  of the cognitive, physiological, and behavioral components of anxiety and its treatment. Target Date: 2022-07-16 Frequency: Biweekly  Progress: 0 Modality: individual  Related Interventions  Discuss how generalized anxiety typically involves excessive worry about unrealistic threats, various bodily expressions of tension, overarousal, and hypervigilance, and avoidance of what is threatening that interact to maintain the problem (see Mastery of Your Anxiety and Worry: Therapist Guide by Corky Mull, and Barlow; Treating Generalized Anxiety Disorder by Rygh and Amparo Bristol). Discuss how treatment targets worry, anxiety symptoms, and avoidance to help the client manage worry effectively, reduce overarousal, and eliminate unnecessary avoidance. Objective  Learn and implement a strategy to limit the association between various environmental settings and worry, delaying the worry until a designated "worry time." Target Date: 2022-07-16 Frequency: Biweekly  Progress: 0 Modality: individual  Related Interventions  Explain the rationale for using a worry time as well as how it is to be used; agree upon and implement a worry time with the client. Teach the client how to recognize, stop, and postpone worry to the agreed upon worry time using skills  such as thought stopping, relaxation, and redirecting attention (or assign "Making Use of the Thought-Stopping Technique" and/or "Worry Time" in the Adult Psychotherapy Homework Planner by J[place]ma to assist skill development); encourage use in daily life; review and reinforce success while providing corrective feedback toward improvement. Objective  Verbalize an understanding of the role that cognitive biases play in excessive irrational worry and persistent anxiety symptoms. Target Date: 2022-07-16 Frequency: Biweekly  Progress: 0 Modality: individual  Related Interventions  Discuss examples demonstrating that unrealistic worry typically overestimates the probability of threats and underestimates or overlooks the client's ability to manage realistic demands (or assign "Past Successful Anxiety Coping" in the Adult Psychotherapy Homework Planner by [facility]). Assist the client in analyzing his/her worries by examining potential biases such as the probability of the negative expectation occurring, the real consequences of it occurring, his/her ability to control the outcome, the worst possible outcome, and his/her ability to accept it (see "Analyze the Probability of a Feared Event" in the Adult Psychotherapy Homework Planner by Bryn Gulling; Cognitive Therapy of Anxiety Disorders by Alison Stalling). Objective  Learn and implement problem-solving strategies for realistically addressing worries. Target Date: 2022-07-16 Frequency: Biweekly  Progress: 0 Modality: individual  Related Interventions  Teach the client problem-solving strategies involving specifically defining a problem, generating options for addressing it, evaluating the pros and cons of each option, selecting and implementing an optional action, and reevaluating and refining the action (or assign "Applying Problem-Solving to Interpersonal Conflict" in the Adult Psychotherapy Homework Planner by Bryn Gulling). Assign the client a homework exercise in  which he/she problem-solves a current problem (see Mastery of Your Anxiety and Worry: Workbook by Adora Fridge and Eliot Ford or Generalized Anxiety Disorder by Eather Colas, and Eliot Ford); review, reinforce success, and provide corrective feedback toward improvement. Objective  Learn to accept limitations in life and commit to tolerating, rather than avoiding, unpleasant emotions while accomplishing meaningful goals. Target Date: 2022-07-16 Frequency: Biweekly  Progress: 0 Modality: individual  Related Interventions  Use techniques from Acceptance and Commitment Therapy to help client accept uncomfortable realities such as lack of complete control, imperfections, and uncertainty and tolerate unpleasant emotions and thoughts in order to accomplish value-consistent goals. Objective  Identify the major life conflicts from the past and present that form the basis for present anxiety. Target Date: 2022-07-16 Frequency: Biweekly  Progress: 0 Modality: individual  Related Interventions  Assist the client in becoming aware of key unresolved  life conflicts and in starting to work toward their resolution. Ask the client to develop and process a list of key past and present life conflicts that continue to cause worry. Reinforce the client's insights into the role of his/her past emotional pain and present anxiety. 8. Learn and implement coping skills that result in a reduction of anxiety and worry, and improved daily functioning.  9. Reach a level of reduced tension, increased satisfaction, and improved communication with family and/or other authority figures.  10. Reach a realistic view and approach to parenting, given the child's developmental level.  11. Reduce overall frequency, intensity, and duration of the anxiety so that daily functioning is not impaired.  12. Resolve financial crisis with a path to eliminate debt.  13. Resolve the core conflict that is the source of anxiety.  14. Revise spending  patterns to not exceed income.  Objective  Describe the details of the current financial situation. Target Date: 2022-07-16 Frequency: Biweekly  Progress: 0 Modality: individual  Related Interventions  Provide the client a supportive, nonjudgmental environment by being empathetic, warm, and sensitive to the fact that the topic may elicit guilt, shame, and embarrassment. Explore the client's current financial situation. Assist the client in compiling a complete list of financial obligations. Objective  Isolate the sources and causes of the excessive indebtedness. Target Date: 2022-07-16 Frequency: Biweekly  Progress: 0 Modality: individual  Related Interventions  Assist in identifying, without projection of blame or holding to excuses, the causes for the financial crisis through a review of the client's history of spending. Objective  Identify personal traits that make undisciplined spending possible. Target Date: 2022-07-16 Frequency: Biweekly  Progress: 0 Modality: individual  Related Interventions  Probe the client for evidence of low self-esteem, need to impress others, loneliness, or depression that may accelerate unnecessary, unwarranted spending. Objective  Use cognitive and behavioral strategies to control the impulse to make unnecessary and unaffordable purchases. Target Date: 2022-07-16 Frequency: Biweekly  Progress: 0 Modality: individual  Related Interventions  Role-play situations in which the client must resist the inner temptation to spend beyond reasonable limits, emphasizing positive self-talk that compliments self for being disciplined. Role-play situations in which the client must resist external pressure to spend beyond what he/she can afford (e.g., friend's invitation to golf or go shopping, child's request for a toy), emphasizing being graciously assertive in refusing the request. Teach the client the cognitive strategy of asking self before each purchase: Is  this purchase absolutely necessary? Can we afford this? Do we have the cash to pay for this without incurring any further debt? Urge the client to avoid all impulse buying by delaying every purchase until after 24 hours of thought and by buying only from a prewritten list of items to buy (consider assigning "Impulsive Behavior Journal" from the Adult Psychotherapy Homework Planner by Bryn Gulling). Objective  Keep weekly and monthly records of financial income and expenses. Target Date: 2022-07-16 Frequency: Biweekly  Progress: 0 Modality: individual  Related Interventions  Encourage the client to keep a weekly and monthly record of income and outflow; review his/her records weekly, and reinforce his/her responsible financial decision-making. Offer praise and ongoing encouragement of the client's progress toward debt resolution; recommend the client read The Total Money Makeover: A Proven Plan for Financial Fitness by Sherrell Puller). Objective  Write a budget that balances income with expenses. Target Date: 2022-07-16 Frequency: Biweekly  Progress: 0 Modality: individual  Related Interventions  Review the client's budget as to reasonableness and completeness.   Diagnosis:Adjustment  disorder with anxiety   Plan:  -meet again on Wednesday, July 29, 2021 at [facility], [facility]

## 2021-07-27 ENCOUNTER — Ambulatory Visit
Admission: RE | Admit: 2021-07-27 | Discharge: 2021-07-27 | Disposition: A | Discharge status: Home / Self Care | Payer: Self-pay | Source: Ambulatory Visit | Attending: Cardiology | Admitting: Cardiology

## 2021-07-27 DIAGNOSIS — Z7189 Other specified counseling: Secondary | ICD-10-CM

## 2021-07-27 DIAGNOSIS — R079 Chest pain, unspecified: Secondary | ICD-10-CM

## 2021-07-29 ENCOUNTER — Ambulatory Visit: Payer: 59 | Admitting: Professional

## 2021-08-05 ENCOUNTER — Other Ambulatory Visit: Payer: Self-pay | Admitting: Obstetrics

## 2021-08-05 DIAGNOSIS — Z1231 Encounter for screening mammogram for malignant neoplasm of breast: Secondary | ICD-10-CM

## 2021-08-07 ENCOUNTER — Ambulatory Visit
Admission: RE | Admit: 2021-08-07 | Discharge: 2021-08-07 | Disposition: A | Discharge status: Home / Self Care | Payer: 59 | Source: Ambulatory Visit | Attending: Obstetrics | Admitting: Obstetrics

## 2021-08-07 DIAGNOSIS — Z1231 Encounter for screening mammogram for malignant neoplasm of breast: Secondary | ICD-10-CM

## 2021-08-09 ENCOUNTER — Encounter: Payer: Self-pay | Admitting: Certified Registered Nurse Anesthetist

## 2021-08-11 ENCOUNTER — Encounter: Payer: Self-pay | Admitting: Certified Registered Nurse Anesthetist

## 2021-08-12 ENCOUNTER — Ambulatory Visit (AMBULATORY_SURGERY_CENTER): Payer: 59 | Admitting: Gastroenterology

## 2021-08-12 ENCOUNTER — Encounter: Payer: Self-pay | Admitting: Gastroenterology

## 2021-08-12 VITALS — BP 162/88 | HR 89 | Temp 97.8°F | Resp 16 | Ht 68.0 in | Wt 195.0 lb

## 2021-08-12 DIAGNOSIS — K648 Other hemorrhoids: Secondary | ICD-10-CM | POA: Diagnosis not present

## 2021-08-12 DIAGNOSIS — D509 Iron deficiency anemia, unspecified: Secondary | ICD-10-CM | POA: Diagnosis present

## 2021-08-12 DIAGNOSIS — K449 Diaphragmatic hernia without obstruction or gangrene: Secondary | ICD-10-CM | POA: Diagnosis not present

## 2021-08-12 DIAGNOSIS — Z1211 Encounter for screening for malignant neoplasm of colon: Secondary | ICD-10-CM

## 2021-08-12 MED ORDER — SODIUM CHLORIDE 0.9 % IV SOLN: 500.0000 mL | Freq: Once | INTRAVENOUS | Status: DC

## 2021-08-12 NOTE — Op Note (Addendum)
[place] Patient Name: Maria [name] Procedure Date: 08/12/2021 1:37 PM MRN: [id number] Endoscopist: Remo Lipps P. Havery Moros , MD Age: 46 Referring MD:  Date of Birth: December 20, 1975 Gender: Female Account #: [id number] Procedure:                Colonoscopy Indications:              history of iron deficiency anemia - first                            colonsocopy Medicines:                Monitored Anesthesia Care Procedure:                Pre-Anesthesia Assessment:                           - Prior to the procedure, a History and Physical                            was performed, and patient medications and                            allergies were reviewed. The patient's tolerance of                            previous anesthesia was also reviewed. The risks                            and benefits of the procedure and the sedation                            options and risks were discussed with the patient.                            All questions were answered, and informed consent                            was obtained. Prior Anticoagulants: The patient has                            taken no previous anticoagulant or antiplatelet                            agents. ASA Grade Assessment: II - A patient with                            mild systemic disease. After reviewing the risks                            and benefits, the patient was deemed in                            satisfactory condition to undergo the procedure.  After obtaining informed consent, the colonoscope                            was passed under direct vision. Throughout the                            procedure, the patient's blood pressure, pulse, and                            oxygen saturations were monitored continuously. The                            [id number]-HQ190L Colonoscope was introduced through the                            anus and advanced to the the terminal ileum,  with                            identification of the appendiceal orifice and IC                            valve. The colonoscopy was performed without                            difficulty. The patient tolerated the procedure                            well. The quality of the bowel preparation was                            good. The terminal ileum, ileocecal valve,                            appendiceal orifice, and rectum were photographed. Scope In: 1:47:28 PM Scope Out: 2:07:37 PM Scope Withdrawal Time: 0 hours 16 minutes 10 seconds  Total Procedure Duration: 0 hours 20 minutes 9 seconds  Findings:                 The perianal and digital rectal examinations were                            normal.                           Limited views of the distal terminal ileum appeared                            normal.                           Internal hemorrhoids were found during retroflexion.                           The right colon was tortous. The exam was otherwise  without abnormality. Complications:            No immediate complications. Estimated blood loss:                            None. Estimated Blood Loss:     Estimated blood loss: none. Impression:               - The examined portion of the distal ileum was                            normal.                           - Internal hemorrhoids.                           - The examination was otherwise normal.                           - No polyps                           No cause for iron deficiency which is most likely                            due to history of heavy menses based on history and                            endoscopic evaluation today. Recommendation:           - Patient has a contact number available for                            emergencies. The signs and symptoms of potential                            delayed complications were discussed with the                             patient. Return to normal activities tomorrow.                            Written discharge instructions were provided to the                            patient.                           - Resume previous diet.                           - Continue present medications.                           - Repeat colonoscopy in 10 years for screening  purposes. Remo Lipps P. Develle Sievers, MD 08/12/2021 2:13:41 PM This report has been signed electronically.

## 2021-08-12 NOTE — Progress Notes (Signed)
1315 Robinul 0.1 mg IV given due large amount of secretions upon assessment.  MD made aware, vss  

## 2021-08-12 NOTE — Progress Notes (Signed)
[facility] History and Physical   Primary Care Physician:  Pleas Koch, NP   Reason for Procedure:   Iron deficiency anemia, CRC screening  Plan:    EGD and colonoscopy     HPI: Maria [name] is a 46 y.o. female  here for EGD and colonoscopy to evaluate issues above. HAd longstanding IDA for years, has recently resolved. Thought at the time due to menses. Patient denies any bowel symptoms at this time. No family history of colon cancer known. Otherwise feels well without any cardiopulmonary symptoms.    Past Medical History:  Diagnosis Date   Abnormal TSH    Cataract    Chest tightness    Dizziness    History of UTI    Hypertension    IDA (iron deficiency anemia)    Palpitations    Precordial pain     Past Surgical History:  Procedure Laterality Date   ABDOMINOPLASTY  2019   CATARACT EXTRACTION Left    2021   COLONOSCOPY     UPPER GASTROINTESTINAL ENDOSCOPY      Prior to Admission medications   Medication Sig Start Date End Date Taking? Authorizing Provider  Cholecalciferol (VITAMIN D3) 1.25 MG (50000 UT) CAPS Take by mouth daily.   Yes [provider]  hydrochlorothiazide (HYDRODIURIL) 12.5 MG tablet Take 1 tablet (12.5 mg total) by mouth daily. For blood pressure. 08/16/20  Yes Pleas Koch, NP  vitamin B-12 (CYANOCOBALAMIN) 100 MCG tablet Take 100 mcg by mouth daily.   Yes [provider]    Current Outpatient Medications  Medication Sig Dispense Refill   Cholecalciferol (VITAMIN D3) 1.25 MG (50000 UT) CAPS Take by mouth daily.     hydrochlorothiazide (HYDRODIURIL) 12.5 MG tablet Take 1 tablet (12.5 mg total) by mouth daily. For blood pressure. 90 tablet 3   vitamin B-12 (CYANOCOBALAMIN) 100 MCG tablet Take 100 mcg by mouth daily.     Current Facility-Administered Medications  Medication Dose Route Frequency Provider Last Rate Last Admin   0.9 %  sodium chloride infusion  500 mL Intravenous Once Elanda Garmany,  Carlota Raspberry, MD        Allergies as of 08/12/2021   (No Known Allergies)    Family History  Problem Relation Age of Onset   Arthritis Maternal Grandmother    Diabetes Maternal Grandmother    Hypertension Maternal Grandmother    Throat cancer Maternal Grandfather    Birth defects Maternal Grandfather    Breast cancer Neg Hx    Colon cancer Neg Hx    Stomach cancer Neg Hx    Esophageal cancer Neg Hx    Rectal cancer Neg Hx     Social History   Socioeconomic History   Marital status: Married    Spouse name: Not on file   Number of children: Not on file   Years of education: Not on file   Highest education level: Not on file  Occupational History   Not on file  Tobacco Use   Smoking status: Never   Smokeless tobacco: Never  Vaping Use   Vaping Use: Never used  Substance and Sexual Activity   Alcohol use: No   Drug use: No   Sexual activity: Yes    Birth control/protection: None  Other Topics Concern   Not on file  Social History Narrative   Married.   2 children.   Works as an Passenger transport manager.   Enjoys relaxing.    Social Determinants of Health   Financial  Resource Strain: Not on file  Food Insecurity: Not on file  Transportation Needs: Not on file  Physical Activity: Not on file  Stress: Not on file  Social Connections: Not on file  Intimate Partner Violence: Not on file    Review of Systems: All other review of systems negative except as mentioned in the HPI.  Physical Exam: Vital signs BP (!) 162/93   Pulse 85   Temp 97.8 F (36.6 C) (Temporal)   Resp 16   Ht '5\' 8"'$  (1.727 m)   Wt 195 lb (88.5 kg)   SpO2 99%   BMI 29.65 kg/m   General:   Alert,  Well-developed, pleasant and cooperative in NAD Lungs:  Clear throughout to auscultation.   Heart:  Regular rate and rhythm Abdomen:  Soft, nontender and nondistended.   Neuro/Psych:  Alert and cooperative. Normal mood and affect. A and O x 3  Jolly Mango, MD [facility]

## 2021-08-12 NOTE — Op Note (Signed)
[place] Patient Name: Maria [name] Procedure Date: 08/12/2021 1:38 PM MRN: [id number] Endoscopist: Remo Lipps P. Havery Moros , MD Age: 46 Referring MD:  Date of Birth: 09-28-1975 Gender: Female Account #: [id number] Procedure:                Upper GI endoscopy Indications:              history of iron deficiency anemia Medicines:                Monitored Anesthesia Care Procedure:                Pre-Anesthesia Assessment:                           - Prior to the procedure, a History and Physical                            was performed, and patient medications and                            allergies were reviewed. The patient's tolerance of                            previous anesthesia was also reviewed. The risks                            and benefits of the procedure and the sedation                            options and risks were discussed with the patient.                            All questions were answered, and informed consent                            was obtained. Prior Anticoagulants: The patient has                            taken no previous anticoagulant or antiplatelet                            agents. ASA Grade Assessment: II - A patient with                            mild systemic disease. After reviewing the risks                            and benefits, the patient was deemed in                            satisfactory condition to undergo the procedure.                           After obtaining informed consent, the endoscope was  passed under direct vision. Throughout the                            procedure, the patient's blood pressure, pulse, and                            oxygen saturations were monitored continuously. The                            GIF HQ190 #[id number] was introduced through the                            mouth, and advanced to the second part of duodenum.                            The upper GI  endoscopy was accomplished without                            difficulty. The patient tolerated the procedure                            well. Scope In: Scope Out: Findings:                 Esophagogastric landmarks were identified: the                            Z-line was found at 40 cm, the gastroesophageal                            junction was found at 40 cm and the upper extent of                            the gastric folds was found at 41 cm from the                            incisors.                           A 1 cm hiatal hernia was present.                           The exam of the esophagus was otherwise normal.                           The entire examined stomach was normal.                           The examined duodenum was normal. Complications:            No immediate complications. Estimated blood loss:                            None. Estimated Blood Loss:     Estimated blood loss: none. Impression:               -  Esophagogastric landmarks identified.                           - 1 cm hiatal hernia.                           - Normal esophagus otherwise                           - Normal stomach.                           - Normal examined duodenum.                           No cause for history of iron deficiency on this                            exam, most likely due to menses, which has since                            improved. Recommendation:           - Patient has a contact number available for                            emergencies. The signs and symptoms of potential                            delayed complications were discussed with the                            patient. Return to normal activities tomorrow.                            Written discharge instructions were provided to the                            patient.                           - Resume previous diet.                           - Continue present medications. Remo Lipps P. [name],  MD 08/12/2021 2:16:41 PM This report has been signed electronically.

## 2021-08-12 NOTE — Progress Notes (Signed)
Report given to PACU, vss 

## 2021-08-12 NOTE — Patient Instructions (Signed)
Handout given on hiatal hernia.  Resume previous diet.  Continue current medications.  YOU HAD AN ENDOSCOPIC PROCEDURE TODAY AT [facility]:   Refer to the procedure report that was given to you for any specific questions about what was found during the examination.  If the procedure report does not answer your questions, please call your gastroenterologist to clarify.  If you requested that your care partner not be given the details of your procedure findings, then the procedure report has been included in a sealed envelope for you to review at your convenience later.  YOU SHOULD EXPECT: Some feelings of bloating in the abdomen. Passage of more gas than usual.  Walking can help get rid of the air that was put into your GI tract during the procedure and reduce the bloating. If you had a lower endoscopy (such as a colonoscopy or flexible sigmoidoscopy) you may notice spotting of blood in your stool or on the toilet paper. If you underwent a bowel prep for your procedure, you may not have a normal bowel movement for a few days.  Please Note:  You might notice some irritation and congestion in your nose or some drainage.  This is from the oxygen used during your procedure.  There is no need for concern and it should clear up in a day or so.  SYMPTOMS TO REPORT IMMEDIATELY:  Following lower endoscopy (colonoscopy or flexible sigmoidoscopy):  Excessive amounts of blood in the stool  Significant tenderness or worsening of abdominal pains  Swelling of the abdomen that is new, acute  Fever of 100F or higher  Following upper endoscopy (EGD)  Vomiting of blood or coffee ground material  New chest pain or pain under the shoulder blades  Painful or persistently difficult swallowing  New shortness of breath  Fever of 100F or higher  Black, tarry-looking stools  For urgent or emergent issues, a gastroenterologist can be reached at any hour by calling [phone]. Do not use MyChart  messaging for urgent concerns.    DIET:  We do recommend a small meal at first, but then you may proceed to your regular diet.  Drink plenty of fluids but you should avoid alcoholic beverages for 24 hours.  ACTIVITY:  You should plan to take it easy for the rest of today and you should NOT DRIVE or use heavy machinery until tomorrow (because of the sedation medicines used during the test).    FOLLOW UP: Our staff will call the number listed on your records the next business day following your procedure.  We will call around 7:15- 8:00 am to check on you and address any questions or concerns that you may have regarding the information given to you following your procedure. If we do not reach you, we will leave a message.  If you develop any symptoms (ie: fever, flu-like symptoms, shortness of breath, cough etc.) before then, please call [phone].  If you test positive for Covid 19 in the 2 weeks post procedure, please call and report this information to [place].    If any biopsies were taken you will be contacted by phone or by letter within the next 1-3 weeks.  Please call us at [phone] if you have not heard about the biopsies in 3 weeks.    SIGNATURES/CONFIDENTIALITY: You and/or your care partner have signed paperwork which will be entered into your electronic medical record.  These signatures attest to the fact that that the information above on your After  Visit Summary has been reviewed and is understood.  Full responsibility of the confidentiality of this discharge information lies with you and/or your care-partner.

## 2021-08-13 ENCOUNTER — Telehealth: Payer: Self-pay

## 2021-08-13 NOTE — Telephone Encounter (Signed)
Left message on follow up call. 

## 2021-08-20 ENCOUNTER — Other Ambulatory Visit: Payer: Self-pay | Admitting: Primary Care

## 2021-08-20 DIAGNOSIS — I10 Essential (primary) hypertension: Secondary | ICD-10-CM

## 2021-08-26 ENCOUNTER — Ambulatory Visit: Payer: 59 | Admitting: Family Medicine

## 2021-08-31 ENCOUNTER — Telehealth: Payer: Self-pay | Admitting: Cardiology

## 2021-08-31 NOTE — Telephone Encounter (Signed)
RN returned call to patient and provided the following information. Patient would like to meet with Dr. Harrell Gave but is unable to until early september. RN scheduled patient. Forwarding as FYI to Isac Caddy, RN.      "Apologies for the delay, catching up from being out of the office. Her calcium score was less than 1--technically not zero, but very low. She is in the gray zone for guidelines--if we want to be aggressive with prevention, I would start rosuvastatin 5 mg daily and recheck labs in 2-3 months. Otherwise I would have her focus on diet/exercise and excellent lifestyle to minimize her risk. Happy to talk about it in more detail at a visit if she prefers. Thanks!"

## 2021-08-31 NOTE — Telephone Encounter (Signed)
Pt returning a call to go over ct results

## 2021-09-05 ENCOUNTER — Ambulatory Visit: Payer: 59 | Admitting: Professional

## 2021-09-10 ENCOUNTER — Encounter: Payer: Self-pay | Admitting: Primary Care

## 2021-09-10 ENCOUNTER — Ambulatory Visit (INDEPENDENT_AMBULATORY_CARE_PROVIDER_SITE_OTHER): Payer: 59 | Admitting: Primary Care

## 2021-09-10 DIAGNOSIS — R238 Other skin changes: Secondary | ICD-10-CM | POA: Diagnosis not present

## 2021-09-10 DIAGNOSIS — F411 Generalized anxiety disorder: Secondary | ICD-10-CM | POA: Diagnosis not present

## 2021-09-10 NOTE — Progress Notes (Signed)
Subjective:    Patient ID: Maria [name], female    DOB: November 07, 1975, 46 y.o.   MRN: [id number]  Rash Pertinent negatives include no shortness of breath.    Maria [name] is a very pleasant 46 y.o. female with a history of hypertension, GAD, anemia who presents today to discuss rash and for renewal of FMLA.  1) GAD: Chronic history, currently not on medication treatment, but she has been meeting with a therapist since May 2023.   She has been on intermittent FMLA over the last 1+ years to allow for more frequent breaks during her work day. During her last visit in November 2022 she requested a continuous leave of absence for symptoms of increased irritability, sleep disturbance, feeling anxious. She never took her continuous leave.   Today she would like to renew her intermittent FMLA for frequent breaks during work hours. She's found this to be beneficial to have on hand if needed. She rarely takes extra breaks but feels more relived knowing that she has the option for more frequent breaks if needed.   Symptoms include nervousness, difficulty focusing, and feeling overwhelmed. The option for more frequent breaks prevents her from calling out of work.   2) Rash: Chronic for the last 3-4 months. Located to the posterior wrist (anatomical position) at the spot where she wears her watch. She wears an apple watch daily to the left wrist. This watch is a newer watch for which she obtained a few months ago. She denies itching and pain.   Review of Systems  Respiratory:  Negative for shortness of breath.   Cardiovascular:  Negative for chest pain.  Skin:  Positive for rash.  Psychiatric/Behavioral:  The patient is nervous/anxious.          Past Medical History:  Diagnosis Date   Abnormal TSH    Cataract    Chest tightness    Dizziness    History of UTI    Hypertension    IDA (iron deficiency anemia)    Palpitations    Precordial pain     Social History    Socioeconomic History   Marital status: Married    Spouse name: Not on file   Number of children: Not on file   Years of education: Not on file   Highest education level: Not on file  Occupational History   Not on file  Tobacco Use   Smoking status: Never   Smokeless tobacco: Never  Vaping Use   Vaping Use: Never used  Substance and Sexual Activity   Alcohol use: No   Drug use: No   Sexual activity: Yes    Birth control/protection: None  Other Topics Concern   Not on file  Social History Narrative   Married.   2 children.   Works as an Passenger transport manager.   Enjoys relaxing.    Social Determinants of Health   Financial Resource Strain: Not on file  Food Insecurity: Not on file  Transportation Needs: Not on file  Physical Activity: Not on file  Stress: Not on file  Social Connections: Not on file  Intimate Partner Violence: Not on file    Past Surgical History:  Procedure Laterality Date   ABDOMINOPLASTY  2019   CATARACT EXTRACTION Left    2021   COLONOSCOPY     UPPER GASTROINTESTINAL ENDOSCOPY      Family History  Problem Relation Age of Onset   Arthritis Maternal Grandmother    Diabetes Maternal Grandmother  Hypertension Maternal Grandmother    Throat cancer Maternal Grandfather    Birth defects Maternal Grandfather    Breast cancer Neg Hx    Colon cancer Neg Hx    Stomach cancer Neg Hx    Esophageal cancer Neg Hx    Rectal cancer Neg Hx     No Known Allergies  Current Outpatient Medications on File Prior to Visit  Medication Sig Dispense Refill   Cholecalciferol (VITAMIN D3) 1.25 MG (50000 UT) CAPS Take by mouth daily.     hydrochlorothiazide (HYDRODIURIL) 12.5 MG tablet TAKE 1 TABLET(12.5 MG) BY MOUTH DAILY FOR BLOOD PRESSURE 90 tablet 0   vitamin B-12 (CYANOCOBALAMIN) 100 MCG tablet Take 100 mcg by mouth daily.     No current facility-administered medications on file prior to visit.    BP 134/76   Pulse 87   Temp 98.4 F (36.9 C) (Oral)    Ht '5\' 8"'$  (1.727 m)   Wt 194 lb (88 kg)   SpO2 97%   BMI 29.50 kg/m  Objective:   Physical Exam Cardiovascular:     Rate and Rhythm: Normal rate and regular rhythm.  Pulmonary:     Effort: Pulmonary effort is normal.     Breath sounds: Normal breath sounds.  Musculoskeletal:     Cervical back: Neck supple.  Skin:    General: Skin is warm and dry.     Comments: Mild hypopigmentation with pink color and mild skin breakdown to left posterior wrist (anatomical position). No wounds.            Assessment & Plan:   Problem List Items Addressed This Visit       Other   GAD (generalized anxiety disorder)    Overall stable.  Continue with therapy. Agree to renew intermittent FMLA for additional breaks during working hours if needed. She will attach forms to her MyChart portal      Skin irritation    To site of the body of her apple watch. Discussed to stop wearing her watch on the left wrist.  We also discussed that she may experience the same reaction to the right wrist and to monitor for those symptoms.             Pleas Koch, NP

## 2021-09-10 NOTE — Patient Instructions (Addendum)
We will update your FMLA forms once received.  Continue to meet with your therapist.   Please schedule a physical to meet with me in late November.    It was a pleasure to see you today!

## 2021-09-10 NOTE — Assessment & Plan Note (Signed)
To site of the body of her apple watch. Discussed to stop wearing her watch on the left wrist.  We also discussed that she may experience the same reaction to the right wrist and to monitor for those symptoms.

## 2021-09-10 NOTE — Assessment & Plan Note (Signed)
Overall stable.  Continue with therapy. Agree to renew intermittent FMLA for additional breaks during working hours if needed. She will attach forms to her MyChart portal

## 2021-09-11 ENCOUNTER — Encounter: Payer: Self-pay | Admitting: Primary Care

## 2021-09-15 ENCOUNTER — Telehealth: Payer: Self-pay | Admitting: Primary Care

## 2021-09-15 ENCOUNTER — Telehealth: Payer: Self-pay

## 2021-09-15 NOTE — Telephone Encounter (Signed)
Completed and placed on Kate's desk 

## 2021-09-15 NOTE — Telephone Encounter (Signed)
Patient in office to drop off FMLA paperwork for Maria [name]  Please fax to the location on document and notify patient when it is complete  Paperwork placed in providers folder up front

## 2021-09-15 NOTE — Telephone Encounter (Signed)
We received FMLA paperwork for patient.  I have made copies and placed them in your inbox for completion and signing.

## 2021-09-15 NOTE — Telephone Encounter (Signed)
Paperwork given to [employer] for completion.

## 2021-09-16 NOTE — Telephone Encounter (Signed)
Completed forms have been faxed to [phone].  Received fax confirmation.  I have made a copy for scanning, patient, and myself.  Ms Yip will pick up a copy from the front office at her convenience.

## 2021-09-22 ENCOUNTER — Telehealth: Payer: Self-pay | Admitting: Primary Care

## 2021-09-22 NOTE — Telephone Encounter (Signed)
Completed and placed on Kate's desk for faxing.

## 2021-09-22 NOTE — Telephone Encounter (Signed)
Placed copy of last form that you filled out in your folder for your review.

## 2021-09-22 NOTE — Telephone Encounter (Signed)
Maria [name], can you pull the copies and place on my desk?  Can we see the copy that I completed recently too?  Is this part missing?

## 2021-09-22 NOTE — Telephone Encounter (Signed)
Patient came in office and stated that her FMLA paperwork Part C Section 3B wasn't filled out. Call back number [phone]

## 2021-09-22 NOTE — Telephone Encounter (Signed)
Allie Bossier updated form today.  Update form was faxed to [phone].  Received fax confirmation. Copies made for patient, scanning, and myself.  Copy is at front office for patient to pick up.

## 2021-09-23 NOTE — Telephone Encounter (Signed)
Duration of visit has been updated and form has been faxed to F# [phone].  Updated version has been placed in front office for patient pickup.

## 2021-10-01 ENCOUNTER — Ambulatory Visit: Payer: 59 | Admitting: Professional

## 2021-10-13 ENCOUNTER — Ambulatory Visit: Payer: 59 | Admitting: Professional

## 2021-10-15 ENCOUNTER — Ambulatory Visit: Payer: 59 | Admitting: Professional

## 2021-10-27 ENCOUNTER — Ambulatory Visit: Payer: 59 | Admitting: Professional

## 2021-10-29 ENCOUNTER — Ambulatory Visit (HOSPITAL_BASED_OUTPATIENT_CLINIC_OR_DEPARTMENT_OTHER): Payer: 59 | Admitting: Cardiology

## 2021-10-29 ENCOUNTER — Ambulatory Visit: Payer: 59 | Admitting: Professional

## 2021-11-12 ENCOUNTER — Ambulatory Visit: Payer: 59 | Admitting: Professional

## 2021-11-19 ENCOUNTER — Other Ambulatory Visit: Payer: Self-pay | Admitting: Primary Care

## 2021-11-19 DIAGNOSIS — I10 Essential (primary) hypertension: Secondary | ICD-10-CM

## 2021-11-23 ENCOUNTER — Ambulatory Visit (INDEPENDENT_AMBULATORY_CARE_PROVIDER_SITE_OTHER): Payer: 59 | Admitting: Dermatology

## 2021-11-23 DIAGNOSIS — L2389 Allergic contact dermatitis due to other agents: Secondary | ICD-10-CM

## 2021-11-23 DIAGNOSIS — L821 Other seborrheic keratosis: Secondary | ICD-10-CM | POA: Diagnosis not present

## 2021-11-23 NOTE — Patient Instructions (Addendum)
For rash at left wrist  Can paint back of watch with clear nail polish or can use a piece of felt or cloth to cover back of watch before wearing.  Can use over the counter hydrocortisone - apply topically to affected areas until clear as needed.     Seborrheic Keratosis  What causes seborrheic keratoses? Seborrheic keratoses are harmless, common skin growths that first appear during adult life.  As time goes by, more growths appear.  Some people may develop a large number of them.  Seborrheic keratoses appear on both covered and uncovered body parts.  They are not caused by sunlight.  The tendency to develop seborrheic keratoses can be inherited.  They vary in color from skin-colored to gray, brown, or even black.  They can be either smooth or have a rough, warty surface.   Seborrheic keratoses are superficial and look as if they were stuck on the skin.  Under the microscope this type of keratosis looks like layers upon layers of skin.  That is why at times the top layer may seem to fall off, but the rest of the growth remains and re-grows.    Treatment Seborrheic keratoses do not need to be treated, but can easily be removed in the office.  Seborrheic keratoses often cause symptoms when they rub on clothing or jewelry.  Lesions can be in the way of shaving.  If they become inflamed, they can cause itching, soreness, or burning.  Removal of a seborrheic keratosis can be accomplished by freezing, burning, or surgery. If any spot bleeds, scabs, or grows rapidly, please return to have it checked, as these can be an indication of a skin cancer.   Melanoma ABCDEs  Melanoma is the most dangerous type of skin cancer, and is the leading cause of death from skin disease.  You are more likely to develop melanoma if you: Have light-colored skin, light-colored eyes, or red or blond hair Spend a lot of time in the sun Tan regularly, either outdoors or in a tanning bed Have had blistering sunburns,  especially during childhood Have a close family member who has had a melanoma Have atypical moles or large birthmarks  Early detection of melanoma is key since treatment is typically straightforward and cure rates are extremely high if we catch it early.   The first sign of melanoma is often a change in a mole or a new dark spot.  The ABCDE system is a way of remembering the signs of melanoma.  A for asymmetry:  The two halves do not match. B for border:  The edges of the growth are irregular. C for color:  A mixture of colors are present instead of an even brown color. D for diameter:  Melanomas are usually (but not always) greater than 75m - the size of a pencil eraser. E for evolution:  The spot keeps changing in size, shape, and color.  Please check your skin once per month between visits. You can use a small mirror in front and a large mirror behind you to keep an eye on the back side or your body.   If you see any new or changing lesions before your next follow-up, please call to schedule a visit.  Please continue daily skin protection including broad spectrum sunscreen SPF 30+ to sun-exposed areas, reapplying every 2 hours as needed when you're outdoors.       Due to recent changes in healthcare laws, you may see results of your pathology and/or  laboratory studies on MyChart before the doctors have had a chance to review them. We understand that in some cases there may be results that are confusing or concerning to you. Please understand that not all results are received at the same time and often the doctors may need to interpret multiple results in order to provide you with the best plan of care or course of treatment. Therefore, we ask that you please give [place] 2 business days to thoroughly review all your results before contacting the office for clarification. Should we see a critical lab result, you will be contacted sooner.   If You Need Anything After Your Visit  If you have  any questions or concerns for your doctor, please call our main line at [phone] and press option 4 to reach your doctor's medical assistant. If no one answers, please leave a voicemail as directed and we will return your call as soon as possible. Messages left after 4 pm will be answered the following business day.   You may also send [place] a message via [place]. We typically respond to MyChart messages within 1-2 business days.  For prescription refills, please ask your pharmacy to contact our office. Our fax number is [phone].  If you have an urgent issue when the clinic is closed that cannot wait until the next business day, you can page your doctor at the number below.    Please note that while we do our best to be available for urgent issues outside of office hours, we are not available 24/7.   If you have an urgent issue and are unable to reach [place], you may choose to seek medical care at your doctor's office, retail clinic, urgent care center, or emergency room.  If you have a medical emergency, please immediately call 911 or go to the emergency department.  Pager Numbers  - Dr. Nehemiah Massed: [phone]  - Dr. Laurence Ferrari: [phone]  - Dr. Nicole Kindred: [phone]  In the event of inclement weather, please call our main line at [phone] for an update on the status of any delays or closures.  Dermatology Medication Tips: Please keep the boxes that topical medications come in in order to help keep track of the instructions about where and how to use these. Pharmacies typically print the medication instructions only on the boxes and not directly on the medication tubes.   If your medication is too expensive, please contact our office at [phone] option 4 or send [place] a message through [place].   We are unable to tell what your co-pay for medications will be in advance as this is different depending on your insurance coverage. However, we may be able to find a substitute medication  at lower cost or fill out paperwork to get insurance to cover a needed medication.   If a prior authorization is required to get your medication covered by your insurance company, please allow [place] 1-2 business days to complete this process.  Drug prices often vary depending on where the prescription is filled and some pharmacies may offer cheaper prices.  The website www.goodrx.com contains coupons for medications through different pharmacies. The prices here do not account for what the cost may be with help from insurance (it may be cheaper with your insurance), but the website can give you the price if you did not use any insurance.  - You can print the associated coupon and take it with your prescription to the pharmacy.  - You may also stop by our office  during regular business hours and pick up a GoodRx coupon card.  - If you need your prescription sent electronically to a different pharmacy, notify our office through [facility] or by phone at [phone] option 4.     Si Usted Necesita Algo Despus de Su Visita  Tambin puede enviarnos un mensaje a travs de Pharmacist, community. Por lo general respondemos a los mensajes de MyChart en el transcurso de 1 a 2 das hbiles.  Para renovar recetas, por favor pida a su farmacia que se ponga en contacto con nuestra oficina. Harland Dingwall de fax es [place] [phone].  Si tiene un asunto urgente cuando la clnica est cerrada y que no puede esperar hasta el siguiente da hbil, puede llamar/localizar a su doctor(a) al nmero que aparece a continuacin.   Por favor, tenga en cuenta que aunque hacemos todo lo posible para estar disponibles para asuntos urgentes fuera del horario de [place], no estamos disponibles las 24 horas del da, los 7 das de la [place].   Si tiene un problema urgente y no puede comunicarse con nosotros, puede optar por buscar atencin mdica  en el consultorio de su doctor(a), en una clnica privada, en un centro de atencin  urgente o en una sala de emergencias.  Si tiene Engineering geologist, por favor llame inmediatamente al 911 o vaya a la sala de emergencias.  Nmeros de bper  - Dr. Nehemiah Massed: [phone]  - Dra. Moye: [phone]  - Dra. Nicole Kindred: [phone]  En caso de inclemencias del [place], por favor llame a Johnsie Kindred principal al [phone] para una actualizacin sobre el [place] de cualquier retraso o cierre.  Consejos para la medicacin en dermatologa: Por favor, guarde las cajas en las que vienen los medicamentos de uso tpico para ayudarle a seguir las instrucciones sobre dnde y cmo usarlos. Las farmacias generalmente imprimen las instrucciones del medicamento slo en las cajas y no directamente en los tubos del [place].   Si su medicamento es muy caro, por favor, pngase en contacto con Zigmund Daniel llamando al [phone] y presione la opcin 4 o envenos un mensaje a travs de Pharmacist, community.   No podemos decirle cul ser su copago por los medicamentos por adelantado ya que esto es diferente dependiendo de la cobertura de su seguro. Sin embargo, es posible que podamos encontrar un medicamento sustituto a Electrical engineer un formulario para que el seguro cubra el medicamento que se considera necesario.   Si se requiere una autorizacin previa para que su compaa de seguros [place] su medicamento, por favor permtanos de 1 a 2 das hbiles para completar este proceso.  Los precios de los medicamentos varan con frecuencia dependiendo del Environmental consultant de dnde se surte la receta y alguna farmacias pueden ofrecer precios ms baratos.  El sitio web www.goodrx.com tiene cupones para medicamentos de Airline pilot. Los precios aqu no tienen en cuenta lo que podra costar con la ayuda del seguro (puede ser ms barato con su seguro), pero el sitio web puede darle el precio si no utiliz Research scientist (physical sciences).  - Puede imprimir el cupn correspondiente y llevarlo con su receta a la farmacia.  -  Tambin puede pasar por nuestra oficina durante el horario de atencin regular y Charity fundraiser una tarjeta de cupones de GoodRx.  - Si necesita que su receta se enve electrnicamente a una farmacia diferente, informe a nuestra oficina a travs de MyChart de [place] o por telfono llamando al [phone] y presione la opcin 4.

## 2021-11-23 NOTE — Progress Notes (Unsigned)
   New Patient Visit  Subjective  Maria [name] is a 46 y.o. female who presents for the following: New Patient (Initial Visit) (Reports recommended by obgyn due to various moles at back. Also would like to discuss rash at left wrist caused by iwatch. ). The patient has spots, moles and lesions to be evaluated, some may be new or changing and the patient has concerns that these could be cancer.  The following portions of the chart were reviewed this encounter and updated as appropriate:   Tobacco  Allergies  Meds  Problems  Med Hx  Surg Hx  Fam Hx     Review of Systems:  No other skin or systemic complaints except as noted in HPI or Assessment and Plan.  Objective  Well appearing patient in no apparent distress; mood and affect are within normal limits.  A focused examination was performed including back, left wrist. Relevant physical exam findings are noted in the Assessment and Plan.  Left Wrist - Posterior Pinkness and licheninfication at left wrist        back Stuck on waxy tan brown papules and/or plaques      Assessment & Plan  Allergic contact dermatitis due to other agents Left Wrist - Posterior See photo Probable nickle in iwatch or  watch band  Recommend  paint back of watch with clear nail polish, apply flannel or felt to back of watch  Can use otc hydrocortisone cream - apply topically to aa qd until clear  Seborrheic keratoses Back See photo Reassured benign age-related growth.  Recommend observation.  Discussed cryotherapy if spot(s) become irritated or inflamed. Patient deferred treatment  Return if symptoms worsen or fail to improve.  IRuthell Rummage, CMA, am acting as scribe for Sarina Ser, MD. Documentation: I have reviewed the above documentation for accuracy and completeness, and I agree with the above.  Sarina Ser, MD

## 2021-11-24 ENCOUNTER — Ambulatory Visit: Payer: 59 | Admitting: Professional

## 2021-11-25 ENCOUNTER — Encounter: Payer: Self-pay | Admitting: Dermatology

## 2021-11-26 ENCOUNTER — Ambulatory Visit: Payer: 59 | Admitting: Professional

## 2021-12-08 ENCOUNTER — Ambulatory Visit: Payer: 59 | Admitting: Professional

## 2021-12-09 ENCOUNTER — Encounter: Payer: Self-pay | Admitting: Primary Care

## 2021-12-09 ENCOUNTER — Other Ambulatory Visit (INDEPENDENT_AMBULATORY_CARE_PROVIDER_SITE_OTHER): Payer: 59

## 2021-12-09 ENCOUNTER — Ambulatory Visit (INDEPENDENT_AMBULATORY_CARE_PROVIDER_SITE_OTHER): Payer: 59 | Admitting: Primary Care

## 2021-12-09 ENCOUNTER — Telehealth: Payer: Self-pay | Admitting: Primary Care

## 2021-12-09 VITALS — BP 146/82 | HR 98 | Temp 97.0°F | Ht 68.0 in | Wt 196.0 lb

## 2021-12-09 DIAGNOSIS — F411 Generalized anxiety disorder: Secondary | ICD-10-CM

## 2021-12-09 DIAGNOSIS — R350 Frequency of micturition: Secondary | ICD-10-CM

## 2021-12-09 DIAGNOSIS — R1013 Epigastric pain: Secondary | ICD-10-CM

## 2021-12-09 DIAGNOSIS — I10 Essential (primary) hypertension: Secondary | ICD-10-CM | POA: Diagnosis not present

## 2021-12-09 DIAGNOSIS — R519 Headache, unspecified: Secondary | ICD-10-CM

## 2021-12-09 HISTORY — DX: Epigastric pain: R10.13

## 2021-12-09 LAB — COMPREHENSIVE METABOLIC PANEL
ALT: 13 U/L (ref 0–35)
AST: 15 U/L (ref 0–37)
Albumin: 4.2 g/dL (ref 3.5–5.2)
Alkaline Phosphatase: 68 U/L (ref 39–117)
BUN: 15 mg/dL (ref 6–23)
CO2: 32 mEq/L (ref 19–32)
Calcium: 9.7 mg/dL (ref 8.4–10.5)
Chloride: 101 mEq/L (ref 96–112)
Creatinine, Ser: 0.92 mg/dL (ref 0.40–1.20)
GFR: 73.92 mL/min (ref >60.00)
Glucose, Bld: 93 mg/dL (ref 70–99)
Potassium: 4 mEq/L (ref 3.5–5.1)
Sodium: 139 mEq/L (ref 135–145)
Total Bilirubin: 0.5 mg/dL (ref 0.2–1.2)
Total Protein: 7.1 g/dL (ref 6.0–8.3)

## 2021-12-09 LAB — POC URINALSYSI DIPSTICK (AUTOMATED)
Bilirubin, UA: NEGATIVE
Blood, UA: POSITIVE
Glucose, UA: NEGATIVE
Ketones, UA: NEGATIVE
Leukocytes, UA: NEGATIVE
Nitrite, UA: NEGATIVE
Protein, UA: NEGATIVE
Spec Grav, UA: 1.02 (ref 1.010–1.025)
Urobilinogen, UA: 0.2 E.U./dL
pH, UA: 5.5 (ref 5.0–8.0)

## 2021-12-09 LAB — CBC WITH DIFFERENTIAL/PLATELET
Basophils Absolute: 0 10*3/uL (ref 0.0–0.1)
Basophils Relative: 0.7 % (ref 0.0–3.0)
Eosinophils Absolute: 0.1 10*3/uL (ref 0.0–0.7)
Eosinophils Relative: 1.7 % (ref 0.0–5.0)
HCT: 40.8 % (ref 36.0–46.0)
Hemoglobin: 13.3 g/dL (ref 12.0–15.0)
Lymphocytes Relative: 33.4 % (ref 12.0–46.0)
Lymphs Abs: 1.5 10*3/uL (ref 0.7–4.0)
MCHC: 32.7 g/dL (ref 30.0–36.0)
MCV: 88.6 fl (ref 78.0–100.0)
Monocytes Absolute: 0.3 10*3/uL (ref 0.1–1.0)
Monocytes Relative: 6.8 % (ref 3.0–12.0)
Neutro Abs: 2.7 10*3/uL (ref 1.4–7.7)
Neutrophils Relative %: 57.4 % (ref 43.0–77.0)
Platelets: 182 10*3/uL (ref 150.0–400.0)
RBC: 4.61 Mil/uL (ref 3.87–5.11)
RDW: 14.5 % (ref 11.5–15.5)
WBC: 4.6 10*3/uL (ref 4.0–10.5)

## 2021-12-09 LAB — LIPASE: Lipase: 3 U/L — ABNORMAL LOW (ref 11.0–59.0)

## 2021-12-09 NOTE — Assessment & Plan Note (Addendum)
Suspect secondary to elevated hypertension, but also could be from anxiety.  Increase HCTZ to 25 mg daily. We will plan to see her back in a few weeks for follow up. If no improvement then consider migraine treatment vs imaging.   She had to leave during our exam as she had to take a phone call from work. Exam was limited given this.

## 2021-12-09 NOTE — Telephone Encounter (Signed)
Pt called in requesting a note for work of what was discuss with PCP . Please advise # [phone]

## 2021-12-09 NOTE — Telephone Encounter (Signed)
Called patient back and advised that Anda Kraft is out of the office this afternoon and will be back 12/10/21. Patient stated that was okay she did not need a work note anymore, she will wait for the paperwork to be finished to give to her employer.

## 2021-12-09 NOTE — Assessment & Plan Note (Signed)
Above goal today, also with home readings.  Given symptoms of a persistent headache without migraine history and without sinus symptoms, will treat by increasing HCTZ to 25 mg daily. We will plan to see her back in a few weeks for BP follow up.  CMP pending.

## 2021-12-09 NOTE — Patient Instructions (Signed)
Increase your blood pressure medication (hydrochlorothiazide) to 25 mg daily. You can take two of your 12.5 mg tablets.   Schedule a lab appointment to return for labs as discussed.   Have your employer send paperwork for your part time hour request.  Please schedule a follow up visit to meet back with me in 2-3 weeks for blood pressure check.   It was a pleasure to see you today!

## 2021-12-09 NOTE — Assessment & Plan Note (Signed)
Uncontrolled, requesting to reduce to part time hours.  Unfortunately, she had to take a call during our visit and leave, so we didn't get to discuss other treatment for her anxiety. I agree to assist her in requesting part time hours with start date of 12/14/21 through 06/14/21. She will have paperwork sent to our office.  We will plan to see her in about 2-3 weeks for follow up. Discuss options for anxiety reduction at that time.

## 2021-12-09 NOTE — Assessment & Plan Note (Signed)
Doesn't appear to be GERD related, likely anxiety, but need to rule out other causes.  Checking CBC, CMP, and Lipase. UA pending.  She will have to return for labs as she had to take a phone call and leave during our visit.  She appears stable for outpatient treatment.

## 2021-12-09 NOTE — Progress Notes (Signed)
Subjective:    Patient ID: Maria [name], female    DOB: Dec 08, 1975, 46 y.o.   MRN: [id number]  Headache  Associated symptoms include abdominal pain and nausea. Pertinent negatives include no fever or vomiting.    Maria [name] is a very pleasant 46 y.o. female with a history of hypertension, GAD, palpitations, chest tightness who presents today to discuss headache, stress, abdominal pain, and reducing her work hours.  Her headache is located to the right frontal lobe which began 2-3 weeks ago. Her headache was intermittent but is now constant. She's been under a lot of stress with work and her personal life.   She would like to cut back on her hours at work due to ongoing stress since August 2023. She's overloading her self with work to try to help pay for her son's college tuition. 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 Has to keep up with metrics and productivity. Symptoms of abdominal cramping, muscle tightness. She would like to start part time work on 12/14/21 for a total of 6 months.   Her abdominal pain began about 6 weeks ago which is located to the mid abdomen with right thoracic back pain and tightness. Her pain is intermittent, worse during the workdays, resolved on the weekends. She denies pain with eating, constipation, bloody stools, esophageal burning. She does notice intermittent nausea.   She's also noticed intermittent, urinary frequency which began around 6 weeks ago. She denies dysuria, hematuria.   Currently managed on HCTZ 12.5 mg for which she is taking daily. She's checking her BP at home which is running 140's/80's.   BP Readings from Last 3 Encounters:  12/09/21 (!) 146/82  09/10/21 134/76  08/12/21 (!) 162/88         Review of Systems  Constitutional:  Negative for fever.  Respiratory:  Negative for shortness of breath.   Cardiovascular:  Negative for chest pain.   Gastrointestinal:  Positive for abdominal pain and nausea. Negative for vomiting.  Neurological:  Positive for headaches.         Past Medical History:  Diagnosis Date   Abnormal TSH    Cataract    Chest tightness    Dizziness    History of UTI    Hypertension    IDA (iron deficiency anemia)    Palpitations    Precordial pain     Social History   Socioeconomic History   Marital status: Married    Spouse name: Not on file   Number of children: Not on file   Years of education: Not on file   Highest education level: Not on file  Occupational History   Not on file  Tobacco Use   Smoking status: Never   Smokeless tobacco: Never  Vaping Use   Vaping Use: Never used  Substance and Sexual Activity   Alcohol use: No   Drug use: No   Sexual activity: Yes    Birth control/protection: None  Other Topics Concern   Not on file  Social History Narrative   Married.   2 children.   Works as an Passenger transport manager.   Enjoys relaxing.    Social Determinants of Health   Financial Resource Strain: Not on file  Food Insecurity: Not on file  Transportation Needs: Not on file  Physical Activity: Not on file  Stress: Not on file  Social Connections: Not on file  Intimate Partner Violence: Not on file  Past Surgical History:  Procedure Laterality Date   ABDOMINOPLASTY  2019   CATARACT EXTRACTION Left    2021   COLONOSCOPY     UPPER GASTROINTESTINAL ENDOSCOPY      Family History  Problem Relation Age of Onset   Arthritis Maternal Grandmother    Diabetes Maternal Grandmother    Hypertension Maternal Grandmother    Throat cancer Maternal Grandfather    Birth defects Maternal Grandfather    Breast cancer Neg Hx    Colon cancer Neg Hx    Stomach cancer Neg Hx    Esophageal cancer Neg Hx    Rectal cancer Neg Hx     No Known Allergies  Current Outpatient Medications on File Prior to Visit  Medication Sig Dispense Refill   Cholecalciferol (VITAMIN D3) 1.25 MG  (50000 UT) CAPS Take by mouth daily.     hydrochlorothiazide (HYDRODIURIL) 12.5 MG tablet Take 1 tablet (12.5 mg total) by mouth daily. for blood pressure. Office visit required for further refills. 90 tablet 0   vitamin B-12 (CYANOCOBALAMIN) 100 MCG tablet Take 100 mcg by mouth daily.     No current facility-administered medications on file prior to visit.    BP (!) 146/82   Pulse 98   Temp (!) 97 F (36.1 C) (Temporal)   Ht '5\' 8"'$  (1.727 m)   Wt 196 lb (88.9 kg)   SpO2 99%   BMI 29.80 kg/m  Objective:   Physical Exam Constitutional:      Appearance: She is not ill-appearing.  Cardiovascular:     Rate and Rhythm: Normal rate and regular rhythm.  Pulmonary:     Effort: Pulmonary effort is normal.     Breath sounds: Normal breath sounds.  Abdominal:     General: Bowel sounds are normal.     Palpations: Abdomen is soft.     Tenderness: There is no abdominal tenderness.  Musculoskeletal:     Cervical back: Neck supple.  Skin:    General: Skin is warm and dry.  Neurological:     Mental Status: She is alert and oriented to person, place, and time.  Psychiatric:     Comments: Appears stressed            Assessment & Plan:   Problem List Items Addressed This Visit       Cardiovascular and Mediastinum   Essential hypertension - Primary    Above goal today, also with home readings.  Given symptoms of a persistent headache without migraine history and without sinus symptoms, will treat by increasing HCTZ to 25 mg daily. We will plan to see her back in a few weeks for BP follow up.  CMP pending.        Other   GAD (generalized anxiety disorder)    Uncontrolled, requesting to reduce to part time hours.  Unfortunately, she had to take a call during our visit and leave, so we didn't get to discuss other treatment for her anxiety. I agree to assist her in requesting part time hours with start date of 12/14/21 through 06/14/21. She will have paperwork sent to our  office.  We will plan to see her in about 2-3 weeks for follow up. Discuss options for anxiety reduction at that time.       Urinary frequency   Relevant Orders   POCT Urinalysis Dipstick (Automated)   Epigastric pain    Doesn't appear to be GERD related, likely anxiety, but need to rule out other causes.  Checking  CBC, CMP, and Lipase. UA pending.  She will have to return for labs as she had to take a phone call and leave during our visit.  She appears stable for outpatient treatment.       Relevant Orders   Lipase   CBC with Differential/Platelet   Comprehensive metabolic panel   Frequent headaches    Suspect secondary to elevated hypertension, but also could be from anxiety.  Increase HCTZ to 25 mg daily. We will plan to see her back in a few weeks for follow up. If no improvement then consider migraine treatment vs imaging.   She had to leave during our exam as she had to take a phone call from work. Exam was limited given this.           Pleas Koch, NP

## 2021-12-10 ENCOUNTER — Telehealth: Payer: Self-pay | Admitting: Primary Care

## 2021-12-10 ENCOUNTER — Ambulatory Visit: Payer: 59 | Admitting: Professional

## 2021-12-10 NOTE — Telephone Encounter (Signed)
Patient came in and dropped off ADA paperwork. Placed in [place] box.

## 2021-12-10 NOTE — Telephone Encounter (Signed)
Form placed in [place] box for review and completion.

## 2021-12-14 NOTE — Telephone Encounter (Signed)
Patient's form for Reasonable Accommodation has been faxed to fax#[phone].  Received fax confirmation.  Copies have been made for patient, scanning, and myself.  Patient's copy has been placed in the front office for pickup.  I left a vmail for patient notifying her that her copy is in the front office.  Also sent mychart msg.  If she wants it mailed out instead, she will call me back or send a mychart msg.

## 2021-12-16 ENCOUNTER — Ambulatory Visit (HOSPITAL_BASED_OUTPATIENT_CLINIC_OR_DEPARTMENT_OTHER): Payer: 59 | Admitting: Cardiology

## 2021-12-18 ENCOUNTER — Telehealth (HOSPITAL_BASED_OUTPATIENT_CLINIC_OR_DEPARTMENT_OTHER): Payer: 59 | Admitting: Cardiology

## 2021-12-18 ENCOUNTER — Encounter (HOSPITAL_BASED_OUTPATIENT_CLINIC_OR_DEPARTMENT_OTHER): Payer: Self-pay

## 2021-12-18 ENCOUNTER — Telehealth (HOSPITAL_BASED_OUTPATIENT_CLINIC_OR_DEPARTMENT_OTHER): Payer: Self-pay | Admitting: *Deleted

## 2021-12-18 NOTE — Telephone Encounter (Signed)
Patient was call 3 times for her virtual visit, pt did not answer the phone

## 2021-12-24 ENCOUNTER — Ambulatory Visit: Payer: 59 | Admitting: Professional

## 2022-01-01 ENCOUNTER — Encounter: Payer: 59 | Admitting: Primary Care

## 2022-01-15 ENCOUNTER — Ambulatory Visit (INDEPENDENT_AMBULATORY_CARE_PROVIDER_SITE_OTHER): Payer: 59 | Admitting: Primary Care

## 2022-01-15 ENCOUNTER — Encounter: Payer: Self-pay | Admitting: Primary Care

## 2022-01-15 VITALS — BP 122/84 | HR 100 | Temp 97.8°F | Ht 67.0 in | Wt 197.0 lb

## 2022-01-15 DIAGNOSIS — Z0000 Encounter for general adult medical examination without abnormal findings: Secondary | ICD-10-CM

## 2022-01-15 DIAGNOSIS — F411 Generalized anxiety disorder: Secondary | ICD-10-CM

## 2022-01-15 DIAGNOSIS — I10 Essential (primary) hypertension: Secondary | ICD-10-CM | POA: Diagnosis not present

## 2022-01-15 DIAGNOSIS — R3129 Other microscopic hematuria: Secondary | ICD-10-CM | POA: Diagnosis not present

## 2022-01-15 DIAGNOSIS — R519 Headache, unspecified: Secondary | ICD-10-CM

## 2022-01-15 MED ORDER — HYDROCHLOROTHIAZIDE 25 MG PO TABS: 25.0000 mg | ORAL_TABLET | Freq: Every day | ORAL | 3 refills | Status: DC

## 2022-01-15 NOTE — Assessment & Plan Note (Signed)
Improved! Also BP controlled.  Continue HCTZ 25 mg daily.

## 2022-01-15 NOTE — Assessment & Plan Note (Signed)
Improved and controlled!  Continue HCTZ 25 mg daily. Will change her dose from 12.5 mg to 25 mg daily.   BMP pending.

## 2022-01-15 NOTE — Assessment & Plan Note (Signed)
Repeat UA and urine microscopic pending.

## 2022-01-15 NOTE — Patient Instructions (Signed)
I sent a new prescription for hydrochlorothiazide at the 25 mg dose. Only take one of these tablets once daily.   Stop by the lab prior to leaving today. I will notify you of your results once received.   Set up a nurse visit for the tetanus and flu vaccines.  It was a pleasure to see you today!   Preventive Care 93-48 Years Old, Female Preventive care refers to lifestyle choices and visits with your health care provider that can promote health and wellness. Preventive care visits are also called wellness exams. What can I expect for my preventive care visit? Counseling Your health care provider may ask you questions about your: Medical history, including: Past medical problems. Family medical history. Pregnancy history. Current health, including: Menstrual cycle. Method of birth control. Emotional well-being. Home life and relationship well-being. Sexual activity and sexual health. Lifestyle, including: Alcohol, nicotine or tobacco, and drug use. Access to firearms. Diet, exercise, and sleep habits. Work and work Statistician. Sunscreen use. Safety issues such as seatbelt and bike helmet use. Physical exam Your health care provider will check your: Height and weight. These may be used to calculate your BMI (body mass index). BMI is a measurement that tells if you are at a healthy weight. Waist circumference. This measures the distance around your waistline. This measurement also tells if you are at a healthy weight and may help predict your risk of certain diseases, such as type 2 diabetes and high blood pressure. Heart rate and blood pressure. Body temperature. Skin for abnormal spots. What immunizations do I need?  Vaccines are usually given at various ages, according to a schedule. Your health care provider will recommend vaccines for you based on your age, medical history, and lifestyle or other factors, such as travel or where you work. What tests do I  need? Screening Your health care provider may recommend screening tests for certain conditions. This may include: Lipid and cholesterol levels. Diabetes screening. This is done by checking your blood sugar (glucose) after you have not eaten for a while (fasting). Pelvic exam and Pap test. Hepatitis B test. Hepatitis C test. HIV (human immunodeficiency virus) test. STI (sexually transmitted infection) testing, if you are at risk. Lung cancer screening. Colorectal cancer screening. Mammogram. Talk with your health care provider about when you should start having regular mammograms. This may depend on whether you have a family history of breast cancer. BRCA-related cancer screening. This may be done if you have a family history of breast, ovarian, tubal, or peritoneal cancers. Bone density scan. This is done to screen for osteoporosis. Talk with your health care provider about your test results, treatment options, and if necessary, the need for more tests. Follow these instructions at home: Eating and drinking  Eat a diet that includes fresh fruits and vegetables, whole grains, lean protein, and low-fat dairy products. Take vitamin and mineral supplements as recommended by your health care provider. Do not drink alcohol if: Your health care provider tells you not to drink. You are pregnant, may be pregnant, or are planning to become pregnant. If you drink alcohol: Limit how much you have to 0-1 drink a day. Know how much alcohol is in your drink. In the [place] one drink equals one 12 oz bottle of beer (355 mL), one 5 oz glass of wine (148 mL), or one 1 oz glass of hard liquor (44 mL). Lifestyle Brush your teeth every morning and night with fluoride toothpaste. Floss one time each day. Exercise for  at least 30 minutes 5 or more days each week. Do not use any products that contain nicotine or tobacco. These products include cigarettes, chewing tobacco, and vaping devices, such as  e-cigarettes. If you need help quitting, ask your health care provider. Do not use drugs. If you are sexually active, practice safe sex. Use a condom or other form of protection to prevent STIs. If you do not wish to become pregnant, use a form of birth control. If you plan to become pregnant, see your health care provider for a prepregnancy visit. Take aspirin only as told by your health care provider. Make sure that you understand how much to take and what form to take. Work with your health care provider to find out whether it is safe and beneficial for you to take aspirin daily. Find healthy ways to manage stress, such as: Meditation, yoga, or listening to music. Journaling. Talking to a trusted person. Spending time with friends and family. Minimize exposure to UV radiation to reduce your risk of skin cancer. Safety Always wear your seat belt while driving or riding in a vehicle. Do not drive: If you have been drinking alcohol. Do not ride with someone who has been drinking. When you are tired or distracted. While texting. If you have been using any mind-altering substances or drugs. Wear a helmet and other protective equipment during sports activities. If you have firearms in your house, make sure you follow all gun safety procedures. Seek help if you have been physically or sexually abused. What's next? Visit your health care provider once a year for an annual wellness visit. Ask your health care provider how often you should have your eyes and teeth checked. Stay up to date on all vaccines. This information is not intended to replace advice given to you by your health care provider. Make sure you discuss any questions you have with your health care provider. Document Revised: 07/30/2020 Document Reviewed: 07/30/2020 Elsevier Patient Education  [place].

## 2022-01-15 NOTE — Assessment & Plan Note (Signed)
Tetanus and influenza due, she will set up a nurse visit to have these done.  Discussed the importance of a healthy diet and regular exercise in order for weight loss, and to reduce the risk of further co-morbidity.  Exam stable. Labs pending.  Follow up in 1 year for repeat physical.

## 2022-01-15 NOTE — Progress Notes (Signed)
Subjective:    Patient ID: Maria [name], female    DOB: 26-Feb-1975, 46 y.o.   MRN: [id number]  HPI  Maria [name] is a very pleasant 46 y.o. female who presents today for complete physical and follow up of chronic conditions.  She was last evaluated on 12/09/21 for a 2-3 week history of headache. Blood pressure was also above goal. Given her symptoms, coupled with BP readings, we increased her HCTZ to 25 mg daily.   Immunizations: -Tetanus: Unsure, declines today -Influenza: Declines today -Pneumonia: 2021  Diet: [place].  Exercise: Daily walking, riding her [place]   Eye exam: Completes annually  Dental exam: Completes semi-annually   Pap Smear: Completed in 2021 Mammogram: Completed in June 2023  Colonoscopy: Completed in 2023, due 2033  BP Readings from Last 3 Encounters:  01/15/22 122/84  12/09/21 (!) 146/82  09/10/21 134/76        Review of Systems  Constitutional:  Negative for unexpected weight change.  HENT:  Negative for rhinorrhea.   Respiratory:  Negative for cough and shortness of breath.   Cardiovascular:  Negative for chest pain.  Gastrointestinal:  Negative for constipation and diarrhea.  Genitourinary:  Negative for difficulty urinating.  Musculoskeletal:  Negative for arthralgias and myalgias.  Skin:  Negative for rash.  Allergic/Immunologic: Negative for environmental allergies.  Neurological:  Negative for numbness and headaches.  Psychiatric/Behavioral:  The patient is not nervous/anxious.          Past Medical History:  Diagnosis Date   Abnormal TSH    Cataract    Chest tightness    Dizziness    History of UTI    Hypertension    IDA (iron deficiency anemia)    Palpitations    Precordial pain     Social History   Socioeconomic History   Marital status: Married    Spouse name: Not on file   Number of children: Not on file   Years of education: Not on file   Highest education level: Not on file  Occupational  History   Not on file  Tobacco Use   Smoking status: Never    Passive exposure: Past   Smokeless tobacco: Never  Vaping Use   Vaping Use: Never used  Substance and Sexual Activity   Alcohol use: No   Drug use: No   Sexual activity: Yes    Birth control/protection: None  Other Topics Concern   Not on file  Social History Narrative   Married.   2 children.   Works as an Passenger transport manager.   Enjoys relaxing.    Social Determinants of Health   Financial Resource Strain: Not on file  Food Insecurity: Not on file  Transportation Needs: Not on file  Physical Activity: Not on file  Stress: Not on file  Social Connections: Not on file  Intimate Partner Violence: Not on file    Past Surgical History:  Procedure Laterality Date   ABDOMINOPLASTY  2019   CATARACT EXTRACTION Left    2021   COLONOSCOPY     UPPER GASTROINTESTINAL ENDOSCOPY      Family History  Problem Relation Age of Onset   Arthritis Maternal Grandmother    Diabetes Maternal Grandmother    Hypertension Maternal Grandmother    Throat cancer Maternal Grandfather    Birth defects Maternal Grandfather    Breast cancer Neg Hx    Colon cancer Neg Hx    Stomach cancer Neg Hx    Esophageal cancer  Neg Hx    Rectal cancer Neg Hx     No Known Allergies  Current Outpatient Medications on File Prior to Visit  Medication Sig Dispense Refill   Cholecalciferol (VITAMIN D3) 1.25 MG (50000 UT) CAPS Take by mouth daily.     Krill Oil 300 MG CAPS Take by mouth.     Magnesium 400 MG TABS Take by mouth.     Probiotic Product (PROBIOTIC PO) Take by mouth.     vitamin B-12 (CYANOCOBALAMIN) 100 MCG tablet Take 100 mcg by mouth daily.     No current facility-administered medications on file prior to visit.    BP 122/84 (BP Location: Left Arm, Patient Position: Sitting, Cuff Size: Large)   Pulse 100   Temp 97.8 F (36.6 C)   Ht '5\' 7"'$  (1.702 m)   Wt 197 lb (89.4 kg)   SpO2 97%   BMI 30.85 kg/m  Objective:   Physical  Exam HENT:     Right Ear: Tympanic membrane and ear canal normal.     Left Ear: Tympanic membrane and ear canal normal.     Nose: Nose normal.  Eyes:     Conjunctiva/sclera: Conjunctivae normal.     Pupils: Pupils are equal, round, and reactive to light.  Neck:     Thyroid: No thyromegaly.  Cardiovascular:     Rate and Rhythm: Normal rate and regular rhythm.     Heart sounds: No murmur heard. Pulmonary:     Effort: Pulmonary effort is normal.     Breath sounds: Normal breath sounds. No rales.  Abdominal:     General: Bowel sounds are normal.     Palpations: Abdomen is soft.     Tenderness: There is no abdominal tenderness.  Musculoskeletal:        General: Normal range of motion.     Cervical back: Neck supple.  Lymphadenopathy:     Cervical: No cervical adenopathy.  Skin:    General: Skin is warm and dry.     Findings: No rash.  Neurological:     Mental Status: She is alert and oriented to person, place, and time.     Cranial Nerves: No cranial nerve deficit.     Deep Tendon Reflexes: Reflexes are normal and symmetric.           Assessment & Plan:   Problem List Items Addressed This Visit       Cardiovascular and Mediastinum   Essential hypertension    Improved and controlled!  Continue HCTZ 25 mg daily. Will change her dose from 12.5 mg to 25 mg daily.   BMP pending.      Relevant Medications   hydrochlorothiazide (HYDRODIURIL) 25 MG tablet   Other Relevant Orders   Hemoglobin B7J   Basic metabolic panel   Lipid panel     Genitourinary   Microscopic hematuria    Repeat UA and urine microscopic pending.       Relevant Orders   Urinalysis, Routine w reflex microscopic     Other   GAD (generalized anxiety disorder)    Significant improvement since reducing work hours!  Continue to monitor.       Preventative health care - Primary    Tetanus and influenza due, she will set up a nurse visit to have these done.  Discussed the importance of a  healthy diet and regular exercise in order for weight loss, and to reduce the risk of further co-morbidity.  Exam stable. Labs pending.  Follow  up in 1 year for repeat physical.       Frequent headaches    Improved! Also BP controlled.  Continue HCTZ 25 mg daily.           Pleas Koch, NP

## 2022-01-15 NOTE — Assessment & Plan Note (Signed)
Significant improvement since reducing work hours!  Continue to monitor.

## 2022-01-15 NOTE — Addendum Note (Signed)
Addended by: Francella Solian on: 01/15/2022 03:02 PM   Modules accepted: Orders

## 2022-01-16 LAB — URINALYSIS, ROUTINE W REFLEX MICROSCOPIC
Bilirubin Urine: NEGATIVE
Glucose, UA: NEGATIVE
Hgb urine dipstick: NEGATIVE
Ketones, ur: NEGATIVE
Leukocytes,Ua: NEGATIVE
Nitrite: NEGATIVE
Protein, ur: NEGATIVE
Specific Gravity, Urine: 1.019 (ref 1.001–1.035)
pH: <=5 (ref 5.0–8.0)

## 2022-01-16 LAB — BASIC METABOLIC PANEL
BUN/Creatinine Ratio: 14 (calc) (ref 6–22)
BUN: 15 mg/dL (ref 7–25)
CO2: 29 mmol/L (ref 20–32)
Calcium: 9.7 mg/dL (ref 8.6–10.2)
Chloride: 104 mmol/L (ref 98–110)
Creat: 1.05 mg/dL — ABNORMAL HIGH (ref 0.50–0.99)
Glucose, Bld: 91 mg/dL (ref 65–99)
Potassium: 3.8 mmol/L (ref 3.5–5.3)
Sodium: 143 mmol/L (ref 135–146)

## 2022-01-16 LAB — HEMOGLOBIN A1C
Hgb A1c MFr Bld: 5.9 % of total Hgb — ABNORMAL HIGH (ref <5.7)
Mean Plasma Glucose: 123 mg/dL
eAG (mmol/L): 6.8 mmol/L

## 2022-01-16 LAB — LIPID PANEL
Cholesterol: 229 mg/dL — ABNORMAL HIGH (ref <200)
HDL: 53 mg/dL (ref >=50)
LDL Cholesterol (Calc): 141 mg/dL (calc) — ABNORMAL HIGH
Non-HDL Cholesterol (Calc): 176 mg/dL (calc) — ABNORMAL HIGH (ref <130)
Total CHOL/HDL Ratio: 4.3 (calc) (ref <5.0)
Triglycerides: 213 mg/dL — ABNORMAL HIGH (ref <150)

## 2022-01-21 ENCOUNTER — Ambulatory Visit: Payer: 59

## 2022-01-21 ENCOUNTER — Ambulatory Visit: Payer: 59 | Admitting: Professional

## 2022-01-27 ENCOUNTER — Telehealth: Payer: Self-pay | Admitting: Primary Care

## 2022-01-27 NOTE — Telephone Encounter (Signed)
Patient called and stated she needs a note to return back to work on January 1. Call back number [phone].

## 2022-01-28 NOTE — Telephone Encounter (Signed)
Pt called back for status of the letter. I told the pt Kate's response, pt stated she had reduced her hours to 30 hours a week but she wants to return to her usual 40 hours a week. Call back # [phone]

## 2022-01-28 NOTE — Telephone Encounter (Signed)
Noted. Letter attached to her MyChart portal.

## 2022-01-28 NOTE — Telephone Encounter (Signed)
Can we get some additional information? I thought she was working, just on reduced working hours. Has she been out of work all together?

## 2022-01-28 NOTE — Telephone Encounter (Signed)
Patient states she has been working 30 hours a week, and feels she has improved a lot since doing this. She needs the letter to state that she is released to return to work 40 hours a week effective January 1st. Letter can be sent through [employer] for her to print off.  She is aware [employer] is out of the office, and there may be a delay in response.

## 2022-02-01 NOTE — Telephone Encounter (Signed)
Pt called in stated her return to work letter in [employer] P[employer] signature for her employer to accept it . Please advise # [phone]

## 2022-02-02 NOTE — Telephone Encounter (Signed)
Spoke with patient, advised that the letter will have to be printed and picked up in order for a signature to be on it. Will place up front for patient to pick up.

## 2022-02-04 ENCOUNTER — Telehealth (HOSPITAL_BASED_OUTPATIENT_CLINIC_OR_DEPARTMENT_OTHER): Payer: Self-pay | Admitting: Cardiology

## 2022-02-04 ENCOUNTER — Ambulatory Visit: Payer: 59 | Admitting: Professional

## 2022-02-04 ENCOUNTER — Ambulatory Visit: Payer: 59

## 2022-02-04 NOTE — Telephone Encounter (Signed)
Patient stated she would like tomorrow's (12/22) appointment to be a virtual visit and will need a call back confirmation.

## 2022-02-05 ENCOUNTER — Encounter (HOSPITAL_BASED_OUTPATIENT_CLINIC_OR_DEPARTMENT_OTHER): Payer: Self-pay | Admitting: Cardiology

## 2022-02-05 ENCOUNTER — Telehealth (HOSPITAL_BASED_OUTPATIENT_CLINIC_OR_DEPARTMENT_OTHER): Payer: Self-pay | Admitting: *Deleted

## 2022-02-05 ENCOUNTER — Ambulatory Visit (INDEPENDENT_AMBULATORY_CARE_PROVIDER_SITE_OTHER): Payer: 59 | Admitting: Cardiology

## 2022-02-05 VITALS — BP 158/88 | HR 94 | Ht 67.0 in | Wt 196.0 lb

## 2022-02-05 DIAGNOSIS — I10 Essential (primary) hypertension: Secondary | ICD-10-CM | POA: Diagnosis not present

## 2022-02-05 DIAGNOSIS — Z7189 Other specified counseling: Secondary | ICD-10-CM

## 2022-02-05 DIAGNOSIS — R079 Chest pain, unspecified: Secondary | ICD-10-CM | POA: Diagnosis not present

## 2022-02-05 NOTE — Progress Notes (Signed)
Virtual Visit via Telephone Note   Because of Maria [name]'s co-morbid illnesses, she is at least at moderate risk for complications without adequate follow up.  This format is felt to be most appropriate for this patient at this time.  The patient did not have access to video technology/had technical difficulties with video requiring transitioning to audio format only (telephone).  All issues noted in this document were discussed and addressed.  No physical exam could be performed with this format.  Please refer to the patient's chart for her consent to telehealth for [facility].   The patient was identified using 2 identifiers.  Patient Location: Home Provider Location: Office/Clinic   Date:  02/05/2022   ID:  Maria [name], DOB 1975/05/17, MRN [id number]  PCP:  Pleas Koch, NP  Cardiologist:  Buford Dresser, MD  Referring MD: Pleas Koch, NP   CC: follow up  History of Present Illness:    Maria [name] is a 46 y.o. female with a hx of hypertension, who is seen as a new consult at the request of Pleas Koch, NP for the evaluation and management of chest pain.  ER note from 03/03/21 reviewed, workup and recommendations from Dr. Laverta Baltimore reviewed. Of note, she had a recent COVID-19 infection in 01/2021.  Patient was last seen by me on 04/06/21 for ED follow up for chest pain evaluation. She has since had a calcium score which resulted as a total of 0.916.  Today, she is doing well overall. Chest pain improved. Her blood pressure has been elevated, as is today's reading. We discussed options for managing this, see below.   Denies shortness of breath at rest or with normal exertion. No PND, orthopnea, LE edema or unexpected weight gain. No syncope or palpitations.   Past Medical History:  Diagnosis Date   Abnormal TSH    Cataract    Chest tightness    Dizziness    History of UTI    Hypertension    IDA (iron deficiency  anemia)    Palpitations    Precordial pain     Past Surgical History:  Procedure Laterality Date   ABDOMINOPLASTY  2019   CATARACT EXTRACTION Left    2021   COLONOSCOPY     UPPER GASTROINTESTINAL ENDOSCOPY      Current Medications: Current Outpatient Medications on File Prior to Visit  Medication Sig   Cholecalciferol (VITAMIN D3) 1.25 MG (50000 UT) CAPS Take by mouth daily.   hydrochlorothiazide (HYDRODIURIL) 25 MG tablet Take 1 tablet (25 mg total) by mouth daily. for blood pressure.   Krill Oil 300 MG CAPS Take by mouth.   Magnesium 400 MG TABS Take by mouth.   Probiotic Product (PROBIOTIC PO) Take by mouth.   vitamin B-12 (CYANOCOBALAMIN) 100 MCG tablet Take 100 mcg by mouth daily.   No current facility-administered medications on file prior to visit.     Allergies:   Patient has no known allergies.   Social History   Tobacco Use   Smoking status: Never    Passive exposure: Past   Smokeless tobacco: Never  Vaping Use   Vaping Use: Never used  Substance Use Topics   Alcohol use: No   Drug use: No    Family History: family history includes Arthritis in her maternal grandmother; Birth defects in her maternal grandfather; Diabetes in her maternal grandmother; Hypertension in her maternal grandmother; Throat cancer in her maternal grandfather. There is no history of Breast  cancer, Colon cancer, Stomach cancer, Esophageal cancer, or Rectal cancer.  ROS:   Please see the history of present illness.   Additional pertinent ROS otherwise unermarkable.  EKGs/Labs/Other Studies Reviewed:    The following studies were reviewed today:  Calcium score 07/27/21: FINDINGS: Coronary arteries: Normal origins. Coronary Calcium Score: Left main: 0 Left anterior descending artery: 0.916 Left circumflex artery: 0 Right coronary artery: 0 Total: 0.916 Percentile: 89 Pericardium: Normal. Ascending Aorta: Normal caliber. Non-cardiac: See separate report from [facility]  Radiology. IMPRESSION: Coronary calcium score of 0.916. This was 9 percentile for age-, race-, and sex-matched controls.  CXR 03/03/2021: COMPARISON:  None. FINDINGS: The heart size and mediastinal contours are within normal limits. Both lungs are clear. The visualized skeletal structures are unremarkable. IMPRESSION: No acute abnormality of the lungs.  EKG:  EKG personally reviewed. 04/06/2021: NSR at 87 bpm  Recent Labs: 12/09/2021: ALT 13; Hemoglobin 13.3; Platelets 182.0 01/15/2022: BUN 15; Creat 1.05; Potassium 3.8; Sodium 143   Recent Lipid Panel    Component Value Date/Time   CHOL 229 (H) 01/15/2022 1452   CHOL 180 12/13/2016 1156   TRIG 213 (H) 01/15/2022 1452   HDL 53 01/15/2022 1452   HDL 56 12/13/2016 1156   CHOLHDL 4.3 01/15/2022 1452   VLDL 15.0 12/31/2020 0844   LDLCALC 141 (H) 01/15/2022 1452    Physical Exam:    VS:  BP (!) 158/88   Pulse 94   Ht '5\' 7"'$  (1.702 m)   Wt 196 lb (88.9 kg)   BMI 30.70 kg/m     Wt Readings from Last 3 Encounters:  01/15/22 197 lb (89.4 kg)  12/09/21 196 lb (88.9 kg)  09/10/21 194 lb (88 kg)    Speaking comfortably on the phone, no audible wheezing In no acute distress Alert and oriented Normal affect Normal speech   ASSESSMENT:    1. Essential hypertension   2. Chest pain of uncertain etiology   3. Cardiac risk counseling   4. Counseling on health promotion and disease prevention     PLAN:    Chest pain -low ASCVD risk, ECG normal, ER evaluation normal during acute symptoms -rare events -very low calcium score, low risk -reviewed red flag warning signs that need immediate medical attention  Hypertension: elevated on home cuff -on HCTZ -we discussed adding medication today vs. Home monitoring. She is not sure that her BP cuff is accurate. She will come in for a nurse visit and bring her BP cuff to have it tested. If BP remains elevated and cuff is accurate, would add medication such as ARB or amlodipine to  improve management  Cardiac risk counseling and prevention recommendations: -recommend heart healthy/Mediterranean diet, with whole grains, fruits, vegetable, fish, lean meats, nuts, and olive oil. Limit salt. -recommend moderate walking, 3-5 times/week for 30-50 minutes each session. Aim for at least 150 minutes.week. Goal should be pace of 3 miles/hours, or walking 1.5 miles in 30 minutes -recommend avoidance of tobacco products. Avoid excess alcohol. -ASCVD risk score: The 10-year ASCVD risk score (Arnett DK, et al., 2019) is: 2.9%   Values used to calculate the score:     Age: 46 years     Sex: Female     Is Non-Hispanic African American: Yes     Diabetic: No     Tobacco smoker: No     Systolic Blood Pressure: 811 mmHg     Is BP treated: Yes     HDL Cholesterol: 53 mg/dL  Total Cholesterol: 229 mg/dL    Plan for follow up: 6 months  Today, I have spent 12 minutes with the patient with telehealth technology discussing the above problems.  Additional time spent in chart review, documentation, and communication.   Buford Dresser, MD, PhD, [facility]    Medication Adjustments/Labs and Tests Ordered: Current medicines are reviewed at length with the patient today.  Concerns regarding medicines are outlined above.   No orders of the defined types were placed in this encounter.  No orders of the defined types were placed in this encounter.  Patient Instructions  Medication Instructions:  Your physician recommends that you continue on your current medications as directed. Please refer to the Current Medication list given to you today.  Labwork: none  Testing/Procedures: none  Follow-Up: 6 MONTHS WITH DR Malasia Torain  NURSE VISIT AND BRING YOUR HOME BLOOD PRESSURE CUFF    Signed, Buford Dresser, MD PhD 02/05/2022     [place]

## 2022-02-05 NOTE — Patient Instructions (Signed)
Medication Instructions:  Your physician recommends that you continue on your current medications as directed. Please refer to the Current Medication list given to you today.  Labwork: none  Testing/Procedures: none  Follow-Up: 6 MONTHS WITH DR CHRISTOPHER  NURSE VISIT AND BRING YOUR HOME BLOOD PRESSURE CUFF

## 2022-02-05 NOTE — Telephone Encounter (Signed)
Tried to call patient to go over AVS from todays visit, left message to call back  Needs 6 month f/u with Dr Harrell Gave and nurse visit, bring home blood pressure machine

## 2022-02-16 ENCOUNTER — Encounter (HOSPITAL_BASED_OUTPATIENT_CLINIC_OR_DEPARTMENT_OTHER): Payer: Self-pay | Admitting: Cardiology

## 2022-02-18 ENCOUNTER — Ambulatory Visit: Payer: 59

## 2022-02-18 ENCOUNTER — Telehealth: Payer: Self-pay

## 2022-02-18 NOTE — Telephone Encounter (Signed)
error 

## 2022-03-03 ENCOUNTER — Telehealth: Payer: Self-pay | Admitting: Primary Care

## 2022-03-03 NOTE — Telephone Encounter (Signed)
Patient called and asked about getting a referral for her stomach pains. Call back number [phone].

## 2022-03-03 NOTE — Telephone Encounter (Signed)
Called patient and she states she is having stomach pains that come and go since she was seen in October 2023 where the issue was addressed. The pain is more of a dull sore feeling. She is requesting a referral be placed to GI in [place].

## 2022-03-03 NOTE — Telephone Encounter (Signed)
Please call patient:  During her visit she mentioned that her pain occurred only during workdays and resolved during the weekends/days off.  Is this still the case?  Any pain with eating/drinking?  Any heartburn?  It looks like she saw [facility] in [place] in June 2023 so she should be able to call them to schedule an appointment.

## 2022-03-04 NOTE — Telephone Encounter (Signed)
Called and spoke to the patient she states the pain is more persistent now, she experiences it when she is at home in the evenings now and sometimes on the weekends. She states her mind is so busy while she is at work, she doesn't notice it. She said it is not worsened or irritated with eating or drinking and she hasn't noticed any symptoms of heartburn.  She states she will reach out to [facility] and make an appointment for further evaluation with them.

## 2022-03-04 NOTE — Telephone Encounter (Signed)
Noted. I'm happy to re-evaluate if she cannot get in with GI.

## 2022-03-09 ENCOUNTER — Ambulatory Visit: Payer: 59 | Admitting: Gastroenterology

## 2022-03-11 ENCOUNTER — Other Ambulatory Visit: Payer: Self-pay | Admitting: Primary Care

## 2022-03-11 DIAGNOSIS — I10 Essential (primary) hypertension: Secondary | ICD-10-CM

## 2022-03-15 ENCOUNTER — Telehealth: Payer: Self-pay | Admitting: Primary Care

## 2022-03-15 NOTE — Telephone Encounter (Signed)
Patient called in stating that she needs her FMLA paperwork extended for 6 months. Will you be able to extend?

## 2022-03-16 NOTE — Telephone Encounter (Signed)
Patient would like her intermittent FMLA leave extended by 6 months.  Paperwork placed in your inbox for date change and signing.  When form is complete, patient will pick up a copy at her next appt.

## 2022-03-16 NOTE — Telephone Encounter (Signed)
Completed and placed on Kate's desk 

## 2022-03-16 NOTE — Telephone Encounter (Signed)
Form has been faxed to fax#[phone].  Received fax confirmation.  Copies made for patient, scanning, and myself.  Patient's copy placed in front office for pick up.  Patient notified via mychart.

## 2022-03-16 NOTE — Telephone Encounter (Signed)
Is she referring to her intermittent FMLA with more frequent breaks? Not the consistent leave, right?  If intermittent, then okay to extend for 6 months.

## 2022-03-24 ENCOUNTER — Ambulatory Visit (INDEPENDENT_AMBULATORY_CARE_PROVIDER_SITE_OTHER): Payer: 59 | Admitting: Dermatology

## 2022-03-24 ENCOUNTER — Encounter: Payer: Self-pay | Admitting: Dermatology

## 2022-03-24 VITALS — BP 145/89 | HR 79

## 2022-03-24 DIAGNOSIS — D229 Melanocytic nevi, unspecified: Secondary | ICD-10-CM

## 2022-03-24 DIAGNOSIS — L814 Other melanin hyperpigmentation: Secondary | ICD-10-CM | POA: Diagnosis not present

## 2022-03-24 DIAGNOSIS — D2262 Melanocytic nevi of left upper limb, including shoulder: Secondary | ICD-10-CM | POA: Diagnosis not present

## 2022-03-24 DIAGNOSIS — D225 Melanocytic nevi of trunk: Secondary | ICD-10-CM | POA: Diagnosis not present

## 2022-03-24 DIAGNOSIS — L731 Pseudofolliculitis barbae: Secondary | ICD-10-CM

## 2022-03-24 DIAGNOSIS — D234 Other benign neoplasm of skin of scalp and neck: Secondary | ICD-10-CM

## 2022-03-24 DIAGNOSIS — L821 Other seborrheic keratosis: Secondary | ICD-10-CM

## 2022-03-24 DIAGNOSIS — D2372 Other benign neoplasm of skin of left lower limb, including hip: Secondary | ICD-10-CM

## 2022-03-24 MED ORDER — TRETINOIN 0.05 % EX CREA: TOPICAL_CREAM | CUTANEOUS | 3 refills | Status: DC

## 2022-03-24 MED ORDER — AZELAIC ACID 15 % EX GEL: CUTANEOUS | 3 refills | Status: DC

## 2022-03-24 MED ORDER — SPIRONOLACTONE 50 MG PO TABS: 100.0000 mg | ORAL_TABLET | Freq: Every day | ORAL | 2 refills | Status: DC

## 2022-03-24 NOTE — Patient Instructions (Addendum)
Face/Chin: Start Spironolactone 50 mg take 1 tablet twice daily. Start out with 1 daily at bedtime, if tolerating well increase to 2 tablets at bedtime.  Start Azelaic acid 15% gel to face in morning, wash off at bedtime.  Start Tretinoin 0.05% cream pea-sized amount to face at bedtime, wash off in morning.  Can apply Neutrogena Hydro Boost Gel or Cream prior to applying tretinon.   Spironolactone can cause increased urination and cause blood pressure to decrease. Please watch for signs of lightheadedness and be cautious when changing position. It can sometimes cause breast tenderness or an irregular period in premenopausal women. It can also increase potassium. The increase in potassium usually is not a concern unless you are taking other medicines that also increase potassium, so please be sure your doctor knows all of the other medications you are taking. This medication should not be taken by pregnant women.  This medicine should also not be taken together with sulfa drugs like Bactrim (trimethoprim/sulfamethexazole).    Topical retinoid medications like tretinoin/Retin-A, adapalene/Differin, tazarotene/Fabior, and Epiduo/Epiduo Forte can cause dryness and irritation when first started. Only apply a pea-sized amount to the entire affected area. Avoid applying it around the eyes, edges of mouth and creases at the nose. If you experience irritation, use a good moisturizer first and/or apply the medicine less often. If you are doing well with the medicine, you can increase how often you use it until you are applying every night. Be careful with sun protection while using this medication as it can make you sensitive to the sun. This medicine should not be used by pregnant women.      Recommend daily Zinc sunscreen SPF 30+ to sun-exposed areas, reapply every 2 hours as needed. Call for new or changing lesions.  Staying in the shade or wearing long sleeves, sun glasses (UVA+UVB protection) and wide  brim hats (4-inch brim around the entire circumference of the hat) are also recommended for sun protection.    A dermatofibroma is a benign growth possibly related to trauma, such as an insect bite, cut from shaving, or inflamed acne-type bump.  Treatment options to remove include shave or excision with resulting scar and risk of recurrence.  Since not bothersome, will observe for now.   Seborrheic Keratosis  What causes seborrheic keratoses? Seborrheic keratoses are harmless, common skin growths that first appear during adult life.  As time goes by, more growths appear.  Some people may develop a large number of them.  Seborrheic keratoses appear on both covered and uncovered body parts.  They are not caused by sunlight.  The tendency to develop seborrheic keratoses can be inherited.  They vary in color from skin-colored to gray, brown, or even black.  They can be either smooth or have a rough, warty surface.   Seborrheic keratoses are superficial and look as if they were stuck on the skin.  Under the microscope this type of keratosis looks like layers upon layers of skin.  That is why at times the top layer may seem to fall off, but the rest of the growth remains and re-grows.    Treatment Seborrheic keratoses do not need to be treated, but can easily be removed in the office.  Seborrheic keratoses often cause symptoms when they rub on clothing or jewelry.  Lesions can be in the way of shaving.  If they become inflamed, they can cause itching, soreness, or burning.  Removal of a seborrheic keratosis can be accomplished by freezing, burning, or  surgery. If any spot bleeds, scabs, or grows rapidly, please return to have it checked, as these can be an indication of a skin cancer.  Melanoma ABCDEs  Melanoma is the most dangerous type of skin cancer, and is the leading cause of death from skin disease.  You are more likely to develop melanoma if you: Have light-colored skin, light-colored eyes, or  red or blond hair Spend a lot of time in the sun Tan regularly, either outdoors or in a tanning bed Have had blistering sunburns, especially during childhood Have a close family member who has had a melanoma Have atypical moles or large birthmarks  Early detection of melanoma is key since treatment is typically straightforward and cure rates are extremely high if we catch it early.   The first sign of melanoma is often a change in a mole or a new dark spot.  The ABCDE system is a way of remembering the signs of melanoma.  A for asymmetry:  The two halves do not match. B for border:  The edges of the growth are irregular. C for color:  A mixture of colors are present instead of an even brown color. D for diameter:  Melanomas are usually (but not always) greater than 16m - the size of a pencil eraser. E for evolution:  The spot keeps changing in size, shape, and color.  Please check your skin once per month between visits. You can use a small mirror in front and a large mirror behind you to keep an eye on the back side or your body.   If you see any new or changing lesions before your next follow-up, please call to schedule a visit.  Please continue daily skin protection including broad spectrum sunscreen SPF 30+ to sun-exposed areas, reapplying every 2 hours as needed when you're outdoors.   Staying in the shade or wearing long sleeves, sun glasses (UVA+UVB protection) and wide brim hats (4-inch brim around the entire circumference of the hat) are also recommended for sun protection.     Due to recent changes in healthcare laws, you may see results of your pathology and/or laboratory studies on MyChart before the doctors have had a chance to review them. We understand that in some cases there may be results that are confusing or concerning to you. Please understand that not all results are received at the same time and often the doctors may need to interpret multiple results in order to  provide you with the best plan of care or course of treatment. Therefore, we ask that you please give u[place]2 business days to thoroughly review all your results before contacting the office for clarification. Should we see a critical lab result, you will be contacted sooner.   If You Need Anything After Your Visit  If you have any questions or concerns for your doctor, please call our main line at [phone] press option 4 to reach your doctor's medical assistant. If no one answers, please leave a voicemail as directed and we will return your call as soon as possible. Messages left after 4 pm will be answered the following business day.   You may also send [place] message via [place] We typically respond to MyChart messages within 1-2 business days.  For prescription refills, please ask your pharmacy to contact our office. Our fax number is [phone]  If you have an urgent issue when the clinic is closed that cannot wait until the next business day, you can page  your doctor at the number below.    Please note that while we do our best to be available for urgent issues outside of office hours, we are not available 24/7.   If you have an urgent issue and are unable to reach [place], you may choose to seek medical care at your doctor's office, retail clinic, urgent care center, or emergency room.  If you have a medical emergency, please immediately call 911 or go to the emergency department.  Pager Numbers  - Dr. Nehemiah Massed: [phone]  - Dr. Laurence Ferrari: [phone]  - Dr. Nicole Kindred: [phone]  In the event of inclement weather, please call our main line at [phone] for an update on the status of any delays or closures.  Dermatology Medication Tips: Please keep the boxes that topical medications come in in order to help keep track of the instructions about where and how to use these. Pharmacies typically print the medication instructions only on the boxes and not directly on the  medication tubes.   If your medication is too expensive, please contact our office at [phone] option 4 or send [place] a message through [place].   We are unable to tell what your co-pay for medications will be in advance as this is different depending on your insurance coverage. However, we may be able to find a substitute medication at lower cost or fill out paperwork to get insurance to cover a needed medication.   If a prior authorization is required to get your medication covered by your insurance company, please allow [place] 1-2 business days to complete this process.  Drug prices often vary depending on where the prescription is filled and some pharmacies may offer cheaper prices.  The website www.goodrx.com contains coupons for medications through different pharmacies. The prices here do not account for what the cost may be with help from insurance (it may be cheaper with your insurance), but the website can give you the price if you did not use any insurance.  - You can print the associated coupon and take it with your prescription to the pharmacy.  - You may also stop by our office during regular business hours and pick up a GoodRx coupon card.  - If you need your prescription sent electronically to a different pharmacy, notify our office through [facility] or by phone at [phone] option 4.     Si Usted Necesita Algo Despus de Su Visita  Tambin puede enviarnos un mensaje a travs de Pharmacist, community. Por lo general respondemos a los mensajes de MyChart en el transcurso de 1 a 2 das hbiles.  Para renovar recetas, por favor pida a su farmacia que se ponga en contacto con nuestra oficina. Harland Dingwall de fax es [place] [phone].  Si tiene un asunto urgente cuando la clnica est cerrada y que no puede esperar hasta el siguiente da hbil, puede llamar/localizar a su doctor(a) al nmero que aparece a continuacin.   Por favor, tenga en cuenta que aunque hacemos todo lo posible  para estar disponibles para asuntos urgentes fuera del horario de [place], no estamos disponibles las 24 horas del da, los 7 das de la [place].   Si tiene un problema urgente y no puede comunicarse con nosotros, puede optar por buscar atencin mdica  en el consultorio de su doctor(a), en una clnica privada, en un centro de atencin urgente o en una sala de emergencias.  Si tiene Engineering geologist, por favor llame inmediatamente al 911 o vaya a la sala de  emergencias.  Nmeros de bper  - Dr. Nehemiah Massed: [phone]  - Dra. Moye: [phone]  - Dra. Nicole Kindred: [phone]  En caso de inclemencias del [place], por favor llame a Johnsie Kindred principal al [phone] para una actualizacin sobre el [place] de cualquier retraso o cierre.  Consejos para la medicacin en dermatologa: Por favor, guarde las cajas en las que vienen los medicamentos de uso tpico para ayudarle a seguir las instrucciones sobre dnde y cmo usarlos. Las farmacias generalmente imprimen las instrucciones del medicamento slo en las cajas y no directamente en los tubos del [place].   Si su medicamento es muy caro, por favor, pngase en contacto con Zigmund Daniel llamando al [phone] y presione la opcin 4 o envenos un mensaje a travs de Pharmacist, community.   No podemos decirle cul ser su copago por los medicamentos por adelantado ya que esto es diferente dependiendo de la cobertura de su seguro. Sin embargo, es posible que podamos encontrar un medicamento sustituto a Electrical engineer un formulario para que el seguro cubra el medicamento que se considera necesario.   Si se requiere una autorizacin previa para que su compaa de seguros [place] su medicamento, por favor permtanos de 1 a 2 das hbiles para completar este proceso.  Los precios de los medicamentos varan con frecuencia dependiendo del Environmental consultant de dnde se surte la receta y alguna farmacias pueden ofrecer precios ms baratos.  El sitio web  www.goodrx.com tiene cupones para medicamentos de Airline pilot. Los precios aqu no tienen en cuenta lo que podra costar con la ayuda del seguro (puede ser ms barato con su seguro), pero el sitio web puede darle el precio si no utiliz Research scientist (physical sciences).  - Puede imprimir el cupn correspondiente y llevarlo con su receta a la farmacia.  - Tambin puede pasar por nuestra oficina durante el horario de atencin regular y Charity fundraiser una tarjeta de cupones de GoodRx.  - Si necesita que su receta se enve electrnicamente a una farmacia diferente, informe a nuestra oficina a travs de MyChart de [place] o por telfono llamando al [phone] y presione la opcin 4.

## 2022-03-24 NOTE — Progress Notes (Signed)
Follow-Up Visit   Subjective  Maria [name] is a 47 y.o. female who presents for the following: Annual Exam (No personal or family hx of skin cancer or dysplastic nevi).  The patient presents for Total-Body Skin Exam (TBSE) for skin cancer screening and mole check.  The patient has spots, moles and lesions to be evaluated, some may be new or changing and the patient has concerns that these could be cancer.   The following portions of the chart were reviewed this encounter and updated as appropriate:      Review of Systems: No other skin or systemic complaints except as noted in HPI or Assessment and Plan.   Objective  Well appearing patient in no apparent distress; mood and affect are within normal limits.  A full examination was performed including scalp, head, eyes, ears, nose, lips, neck, chest, axillae, abdomen, back, buttocks, bilateral upper extremities, bilateral lower extremities, hands, feet, fingers, toes, fingernails, and toenails. All findings within normal limits unless otherwise noted below.  left neck 4 x 7 cm speckled brown-tan patch  chin, neck Scattered hyperpigmented papules and patches  Left Axilla, Left Upper Arm - Posterior 5 mm med-dark brown macule with darker center at left axilla  4 mm med-dark brown macule, lighter edge at posterior left upper arm   Assessment & Plan   Lentigines. Shoulders.  - Scattered tan macules - Due to sun exposure - Benign-appearing, observe - Recommend daily broad spectrum sunscreen SPF 30+ to sun-exposed areas, reapply every 2 hours as needed. - Call for any changes  Seborrheic Keratoses. Legs, back - Stuck-on, waxy, tan-brown papules - Benign-appearing - Discussed benign etiology and prognosis. - Observe - Call for any changes  Melanocytic Nevi - Tan-brown and/or pink-flesh-colored symmetric macules and papules - Benign appearing on exam today - Observation - Call clinic for new or changing moles -  Recommend daily use of broad spectrum spf 30+ sunscreen to sun-exposed areas.   Hemangiomas - Red papules - Discussed benign nature - Observe - Call for any changes   Skin cancer screening performed today.  Dermatofibromas. Legs, left posterior upper arm - Firm pink/brown papulenodule with dimple sign - Benign appearing. A dermatofibroma is a benign growth possibly related to trauma, such as an insect bite, cut from shaving, or inflamed acne-type bump.  Treatment options to remove include shave or excision with resulting scar and risk of recurrence.  Since not bothersome, will observe for now.  - Call for any changes  Nevus spilus left neck  Benign-appearing.  Observation.  Call clinic for new or changing lesions.  Recommend daily use of broad spectrum spf 30+ sunscreen to sun-exposed areas.    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 Condition is bothersome/symptomatic for patient. Currently flared.   Discussed [facility]- will refer to Lind Covert in [facility] Avoid plucking hair  Start Spironolactone 50 mg take 1 tablet twice daily. Start out with 1 daily at bedtime, if tolerating well increase to 2 tablets at bedtime.  Pt also taking 25 mg HCTZ qam.  Start Azelaic acid 15% gel to face in morning, wash off at bedtime.  Start Tretinoin 0.05% cream pea-sized amount to face at bedtime as tolerated, wash off in morning.   Spironolactone can cause increased urination and cause blood pressure to decrease. Please watch for signs of lightheadedness and be cautious when changing position. It can sometimes cause breast tenderness or an irregular period  in premenopausal women. It can also increase potassium. The increase in potassium usually is not a concern unless you are taking other medicines that also increase potassium, so please be sure your doctor knows all of the other medications you are taking. This  medication should not be taken by pregnant women.  This medicine should also not be taken together with sulfa drugs like Bactrim (trimethoprim/sulfamethexazole).    Topical retinoid medications like tretinoin/Retin-A, adapalene/Differin, tazarotene/Fabior, and Epiduo/Epiduo Forte can cause dryness and irritation when first started. Only apply a pea-sized amount to the entire affected area. Avoid applying it around the eyes, edges of mouth and creases at the nose. If you experience irritation, use a good moisturizer first and/or apply the medicine less often. If you are doing well with the medicine, you can increase how often you use it until you are applying every night. Be careful with sun protection while using this medication as it can make you sensitive to the sun. This medicine should not be used by pregnant women.    tretinoin (RETIN-A) 0.05 % cream - chin, neck Apply pea-sized amount to face at bedtime, wash off in morning.  spironolactone (ALDACTONE) 50 MG tablet - chin, neck Take 2 tablets (100 mg total) by mouth daily.  Azelaic Acid 15 % gel - chin, neck Apply thin film to face in morning, wash off at bedtime.  Nevus (2) Left Axilla; Left Upper Arm - Posterior  Benign-appearing.  Observation.  Call clinic for new or changing lesions.  Recommend daily use of broad spectrum spf 30+ sunscreen to sun-exposed areas.     Return for PFB recheck 2-3 months .  I, Emelia Salisbury, CMA, am acting as scribe for Brendolyn Patty, MD.  Documentation: I have reviewed the above documentation for accuracy and completeness, and I agree with the above.  Brendolyn Patty MD

## 2022-04-22 NOTE — Telephone Encounter (Signed)
No answer/ callback, pt. Overdue to schedule.

## 2022-04-30 ENCOUNTER — Ambulatory Visit: Payer: 59 | Admitting: Gastroenterology

## 2022-06-14 ENCOUNTER — Ambulatory Visit: Payer: 59 | Admitting: Dermatology

## 2022-07-08 ENCOUNTER — Other Ambulatory Visit: Payer: Self-pay | Admitting: Primary Care

## 2022-07-08 DIAGNOSIS — Z0000 Encounter for general adult medical examination without abnormal findings: Secondary | ICD-10-CM

## 2022-07-14 ENCOUNTER — Ambulatory Visit: Payer: 59 | Admitting: Dermatology

## 2022-07-21 ENCOUNTER — Ambulatory Visit: Payer: 59 | Admitting: Gastroenterology

## 2022-07-21 NOTE — Progress Notes (Deleted)
HPI : last seen 03/7383:  47 year old female with history of iron deficiency, chest pain, hypertension, referred by Vernona Rieger, NP for work-up of iron deficiency and screening colonoscopy.   Looking through the patient's record and discussing with her, she has a history of iron deficiency dating back to 2018 from what I can see.  At that point in time she had a microcytic anemia with a hemoglobin of eights to nines with low ferritin levels as well as 3.  She had low iron levels through 2021 or so, but her hemoglobin improved over time.  She states she was given iron supplementation to take but caused her to have upset stomach so she did not take it much.  At the time when she was anemic she states she was having very heavy menstrual cycles.  She has since become perimenopausal in recent years and her menses are infrequent.  Over time her anemia has resolved, her hemoglobin was 12.6 with MCV of 90 in January.   She denies ever seeing blood in her stools.  She states her bowel habits are regular.  She denies any problems with eating.  No nausea or vomiting, no reflux symptoms.  She occasionally has to clear her throat after eating, but no overt regurgitation or pyrosis.  No postprandial abdominal pain.  No abdominal pain in general on a frequent basis, occasionally has lower abdominal pain on the right side that she thinks might be musculoskeletal, only occurs while sitting in a chair at work and outside of this situation she does not have pain.  She states it is mild, ongoing for the past year.  She denies any routine NSAID use, perhaps takes Advil PM once a week.  She takes vitamin D and B12 supplement over-the-counter otherwise.   She has never had an EGD or colonoscopy.  No family history of GI tract malignancies.  No tobacco use.   The patient was seen in the emergency department with chest pain back in January.  She was referred to cardiology, saw Dr. Cristal Deer in February.  She was referred  for cardiac CT to make sure everything is okay although symptoms not thought to be cardiac in etiology.  Patient states she thought her symptoms were due to anxiety at the time.  She has not had any recurrence of chest pain and denies any cardiopulmonary symptoms currently.  47 year old female here for new patient assessment of the following:   Iron deficiency anemia Colon cancer screening History of chest pain   As above, patient with microcytic anemia with hemoglobin of eights to nines with severe iron deficiency dating back to 2018 on review of the chart.  This was thought to be due to heavy menses at the time.  She has since become perimenopausal, menses are infrequent and her anemia has resolved without any iron supplementation.  Her MCV is currently in the 90s.  We discussed iron deficiency in general and potential etiologies.  Again it sounds very likely that this was due to her menstrual cycle however given her age, endoscopic evaluation is recommended to ensure no GI tract etiology, especially as she has not had a colonoscopy before and is overdue for screening.  I discussed EGD and colonoscopy with her, risks and benefits of the exams and anesthesia, she wants to proceed.  She is otherwise asymptomatic at this time.  I will recheck her iron studies to make sure normal, suspect they are given her MCV.   She wants to proceed with these  exams, however given her history of chest pain, we will await her cardiac CT to be done first to ensure okay.  She denies any cardiopulmonary symptoms currently, she thinks her chest pain was due to anxiety at the time, will await that test first we will proceed with endoscopic evaluation.  She agrees with plan as outlined.   Plan: - lab for TIBC / ferritin - schedule for EGD / colonoscopy   - awaiting cardiac CT to be completed prior to her endoscopic procedures      EGD 08/12/21:- Esophagogastric landmarks were identified: the Z-line was found at 40 cm,  the gastroesophageal junction was found at 40 cm and the upper extent of the gastric folds was found at 41 cm from the incisors. Findings: - A 1 cm hiatal hernia was present. - The exam of the esophagus was otherwise normal. - The entire examined stomach was normal. - The examined duodenum was normal.  Colonoscopy 08/12/21:The perianal and digital rectal examinations were normal. Limited views of the distal terminal ileum appeared normal. Internal hemorrhoids were found during retroflexion. The right colon was tortous. The exam was otherwise without abnormality.   CT scan 12/2011: IMPRESSION:  No renal or ureteral stones.  No hydronephrosis.   Umbilical hernia containing fat and a single small bowel loop.  No  obstruction.  Past Medical History:  Diagnosis Date   Abnormal TSH    Cataract    Chest tightness    Dizziness    History of UTI    Hypertension    IDA (iron deficiency anemia)    Palpitations    Precordial pain      Past Surgical History:  Procedure Laterality Date   ABDOMINOPLASTY  2019   CATARACT EXTRACTION Left    2021   COLONOSCOPY     UPPER GASTROINTESTINAL ENDOSCOPY     Family History  Problem Relation Age of Onset   Arthritis Maternal Grandmother    Diabetes Maternal Grandmother    Hypertension Maternal Grandmother    Throat cancer Maternal Grandfather    Birth defects Maternal Grandfather    Breast cancer Neg Hx    Colon cancer Neg Hx    Stomach cancer Neg Hx    Esophageal cancer Neg Hx    Rectal cancer Neg Hx    Social History   Tobacco Use   Smoking status: Never    Passive exposure: Past   Smokeless tobacco: Never  Vaping Use   Vaping Use: Never used  Substance Use Topics   Alcohol use: No   Drug use: No   Current Outpatient Medications  Medication Sig Dispense Refill   Azelaic Acid 15 % gel Apply thin film to face in morning, wash off at bedtime. 50 g 3   Cholecalciferol (VITAMIN D3) 1.25 MG (50000 UT) CAPS Take by mouth daily.      hydrochlorothiazide (HYDRODIURIL) 25 MG tablet Take 1 tablet (25 mg total) by mouth daily. for blood pressure. 90 tablet 3   Krill Oil 300 MG CAPS Take by mouth.     Magnesium 400 MG TABS Take by mouth.     Probiotic Product (PROBIOTIC PO) Take by mouth.     spironolactone (ALDACTONE) 50 MG tablet Take 2 tablets (100 mg total) by mouth daily. 60 tablet 2   tretinoin (RETIN-A) 0.05 % cream Apply pea-sized amount to face at bedtime, wash off in morning. 45 g 3   vitamin B-12 (CYANOCOBALAMIN) 100 MCG tablet Take 100 mcg by mouth daily.  No current facility-administered medications for this visit.   No Known Allergies   Review of Systems: All systems reviewed and negative except where noted in HPI.    No results found.  Physical Exam: There were no vitals taken for this visit. Constitutional: Pleasant,well-developed, ***female in no acute distress. HEENT: Normocephalic and atraumatic. Conjunctivae are normal. No scleral icterus. Neck supple.  Cardiovascular: Normal rate, regular rhythm.  Pulmonary/chest: Effort normal and breath sounds normal. No wheezing, rales or rhonchi. Abdominal: Soft, nondistended, nontender. Bowel sounds active throughout. There are no masses palpable. No hepatomegaly. Extremities: no edema Lymphadenopathy: No cervical adenopathy noted. Neurological: Alert and oriented to person place and time. Skin: Skin is warm and dry. No rashes noted. Psychiatric: Normal mood and affect. Behavior is normal.   ASSESSMENT: 47 y.o. female here for assessment of the following  No diagnosis found.  PLAN:   Doreene Nest, NP

## 2022-07-23 ENCOUNTER — Telehealth: Payer: Self-pay | Admitting: Primary Care

## 2022-07-23 NOTE — Telephone Encounter (Signed)
Please notify patient that Maria [name] does not need an appointment to extend her FMLA.  We have met about this several times previously.  I do need dates.  The date her FMLA expires and the date which Maria [name] would like to extend through.

## 2022-07-23 NOTE — Telephone Encounter (Signed)
PT called in wants to know if she need a appointment to get an extension on her FMLA . Please advise [phone]

## 2022-07-26 NOTE — Telephone Encounter (Signed)
Expires 09/11/22. She is requesting the FMLA be extended through next year if possible, 09/11/2023. If you are no agreeable to a year she said that another 6 months would be fine (03/14/23). Patient will come drop off new paperwork to be completed.

## 2022-08-04 NOTE — Telephone Encounter (Signed)
Called and spoke with patient she is planning on dropping the paperwork off.

## 2022-08-04 NOTE — Telephone Encounter (Signed)
I have not seen this paperwork, which she supposed to be dropping this off?

## 2022-08-09 ENCOUNTER — Ambulatory Visit: Payer: 59

## 2022-08-09 NOTE — Telephone Encounter (Signed)
Noted.  No office visit needed, but just to clarify she is requesting a continuous leave of absence from work for 6 weeks for anxiety/stress?  Have her request paperwork from HR once we have confirmed what she is requesting.

## 2022-08-09 NOTE — Telephone Encounter (Signed)
Pt called to let Chestine Spore know she wanted to move forward with the short term disability process for 6 weeks. Pt requested a call back from [facility] to discuss more. Call back # [phone]

## 2022-08-09 NOTE — Telephone Encounter (Signed)
Called and spoke with patient. She is requesting a continuous leave of absence from work for 6 weeks (08/09/22-09/20/22) for anxiety/stress. Patient states HR is faxing the needed paperwork to [place]. I have not received it yet, but will be on the lookout.

## 2022-08-10 NOTE — Telephone Encounter (Signed)
Pt called checking status of ppw received by our office? Call back # [phone]

## 2022-08-11 NOTE — Telephone Encounter (Signed)
Called and advised patient we received paperwork. Printed and placed in [place] inbox for review and completion.

## 2022-08-11 NOTE — Telephone Encounter (Signed)
Form has been faxed as requested per patient

## 2022-08-11 NOTE — Telephone Encounter (Signed)
Completed and placed in Kelli's inbox. 

## 2022-08-13 NOTE — Telephone Encounter (Signed)
Victorino Dike from [place] called stating that they did not receive the form that was faxed back and would like to know if it could be refaxed.

## 2022-08-16 NOTE — Telephone Encounter (Signed)
Form has been refaxed.

## 2022-08-17 NOTE — Telephone Encounter (Signed)
Patient called back stating that the fax number that forms were originally faxed to was incorrect,the correct number for them to be refaxed to is [phone]. She would like a phone call once they are refaxed to this number.

## 2022-08-25 ENCOUNTER — Telehealth: Payer: Self-pay | Admitting: Primary Care

## 2022-08-25 NOTE — Telephone Encounter (Signed)
Patient has picked up correct paperwork

## 2022-08-25 NOTE — Telephone Encounter (Signed)
Called and spoke with patient, she was given the wrong ppw when she picked up. She will come by and grab the correct ppw.

## 2022-08-25 NOTE — Telephone Encounter (Signed)
Pt came in office requesting a copy of her FMLA paperwork . Would like a call when its ready to be pick up .  [phone]

## 2022-08-25 NOTE — Telephone Encounter (Signed)
See mychart message encounter

## 2022-08-30 ENCOUNTER — Ambulatory Visit (INDEPENDENT_AMBULATORY_CARE_PROVIDER_SITE_OTHER): Payer: 59 | Admitting: Primary Care

## 2022-08-30 VITALS — BP 120/82 | HR 96 | Temp 98.2°F | Ht 67.0 in | Wt 190.0 lb

## 2022-08-30 DIAGNOSIS — F411 Generalized anxiety disorder: Secondary | ICD-10-CM | POA: Diagnosis not present

## 2022-08-30 DIAGNOSIS — R232 Flushing: Secondary | ICD-10-CM | POA: Diagnosis not present

## 2022-08-30 MED ORDER — VENLAFAXINE HCL ER 37.5 MG PO CP24: 37.5000 mg | ORAL_CAPSULE | Freq: Every day | ORAL | 0 refills | Status: DC

## 2022-08-30 NOTE — Patient Instructions (Signed)
Start venlafaxine ER 37.5 mg for anxiety and hot flashes.  Take 1 capsule by mouth every morning with breakfast.  Please update me in about 4 weeks.  Schedule your physical for December 2024.  It was a pleasure to see you today!

## 2022-08-30 NOTE — Assessment & Plan Note (Signed)
Deteriorated.  Discussed options. Start venlafaxine ER 37.5 mg every morning.  Agree to continue intermittent FMLA as previously written. She will have paperwork sent to [place].

## 2022-08-30 NOTE — Assessment & Plan Note (Signed)
Discussed nonhormonal options.  Start venlafaxine ER 37.5 mg daily. Discussed potential side effects, instructions for use. She will update in about 4 weeks.

## 2022-08-30 NOTE — Progress Notes (Signed)
Subjective:    Patient ID: Maria [name], female    DOB: May 26, 1975, 47 y.o.   MRN: [id number]  HPI  Maria [name] is a very pleasant 47 y.o. female with a history of hypertension, GAD, palpitations, chest tightness, frequent headaches who presents today to discuss headaches and insomnia.  She is currently on continuous FMLA for ongoing stress at work since August 2023. She is scheduled to return August 5th. Upon her return to work on August 5th she would like to resume her intermittent FMLA due to anxiety and stress. Her job his "high stress and fast paced". She works for [employer] at home, works M-Th 7a-5:30p.   She would like to resume her prior intermittent FMLA agreement as this has helped tremendously with overall stress/anxiety during the day.  She struggles with inconsistent sleep which increases her anxiety for work, decreased concentration, hot flashes, feeling nervous, worrying. She believes she's going through menopause.   She does follow with GYN, was offered HRT for which she was reticent. She is interested in non hormonal options for treatment of hot flashes.   Review of Systems  Respiratory:  Negative for shortness of breath.   Cardiovascular:  Negative for chest pain.  Neurological:  Positive for headaches.  Psychiatric/Behavioral:  Positive for sleep disturbance. The patient is nervous/anxious.          Past Medical History:  Diagnosis Date   Abnormal TSH    Cataract    Chest tightness    Dizziness    History of UTI    Hypertension    IDA (iron deficiency anemia)    Palpitations    Precordial pain     Social History   Socioeconomic History   Marital status: Married    Spouse name: Not on file   Number of children: Not on file   Years of education: Not on file   Highest education level: Not on file  Occupational History   Not on file  Tobacco Use   Smoking status: Never    Passive exposure: Past   Smokeless tobacco: Never   Vaping Use   Vaping status: Never Used  Substance and Sexual Activity   Alcohol use: No   Drug use: No   Sexual activity: Yes    Birth control/protection: None  Other Topics Concern   Not on file  Social History Narrative   Married.   2 children.   Works as an Scientist, water quality.   Enjoys relaxing.    Social Determinants of Health   Financial Resource Strain: Not on file  Food Insecurity: Not on file  Transportation Needs: Not on file  Physical Activity: Not on file  Stress: Not on file  Social Connections: Unknown (06/30/2021)   Received from [facility]   Social Network    Social Network: Not on file  Intimate Partner Violence: Unknown (05/22/2021)   Received from [facility]   HITS    Physically Hurt: Not on file    Insult or Talk Down To: Not on file    Threaten Physical Harm: Not on file    Scream or Curse: Not on file    Past Surgical History:  Procedure Laterality Date   ABDOMINOPLASTY  2019   CATARACT EXTRACTION Left    2021   COLONOSCOPY     UPPER GASTROINTESTINAL ENDOSCOPY      Family History  Problem Relation Age of Onset   Arthritis Maternal Grandmother    Diabetes Maternal Grandmother  Hypertension Maternal Grandmother    Throat cancer Maternal Grandfather    Birth defects Maternal Grandfather    Breast cancer Neg Hx    Colon cancer Neg Hx    Stomach cancer Neg Hx    Esophageal cancer Neg Hx    Rectal cancer Neg Hx     No Known Allergies  Current Outpatient Medications on File Prior to Visit  Medication Sig Dispense Refill   hydrochlorothiazide (HYDRODIURIL) 25 MG tablet Take 1 tablet (25 mg total) by mouth daily. for blood pressure. 90 tablet 3   Azelaic Acid 15 % gel Apply thin film to face in morning, wash off at bedtime. (Patient not taking: Reported on 08/30/2022) 50 g 3   Cholecalciferol (VITAMIN D3) 1.25 MG (50000 UT) CAPS Take by mouth daily.     Krill Oil 300 MG CAPS Take by mouth. (Patient not taking: Reported on 08/30/2022)      Magnesium 400 MG TABS Take by mouth. (Patient not taking: Reported on 08/30/2022)     Probiotic Product (PROBIOTIC PO) Take by mouth. (Patient not taking: Reported on 08/30/2022)     spironolactone (ALDACTONE) 50 MG tablet Take 2 tablets (100 mg total) by mouth daily. (Patient not taking: Reported on 08/30/2022) 60 tablet 2   tretinoin (RETIN-A) 0.05 % cream Apply pea-sized amount to face at bedtime, wash off in morning. (Patient not taking: Reported on 08/30/2022) 45 g 3   vitamin B-12 (CYANOCOBALAMIN) 100 MCG tablet Take 100 mcg by mouth daily. (Patient not taking: Reported on 08/30/2022)     No current facility-administered medications on file prior to visit.    BP 120/82   Pulse 96   Temp 98.2 F (36.8 C) (Temporal)   Ht 5\' 7"  (1.702 m)   Wt 190 lb (86.2 kg)   SpO2 99%   BMI 29.76 kg/m  Objective:   Physical Exam Cardiovascular:     Rate and Rhythm: Normal rate and regular rhythm.  Pulmonary:     Effort: Pulmonary effort is normal.     Breath sounds: Normal breath sounds.  Musculoskeletal:     Cervical back: Neck supple.  Skin:    General: Skin is warm and dry.  Psychiatric:        Mood and Affect: Mood normal.           Assessment & Plan:  GAD (generalized anxiety disorder) Assessment & Plan: Deteriorated.  Discussed options. Start venlafaxine ER 37.5 mg every morning.  Agree to continue intermittent FMLA as previously written. She will have paperwork sent to [place].  Orders: -     Venlafaxine HCl ER; Take 1 capsule (37.5 mg total) by mouth daily with breakfast. For anxiety and hot flashes.  Dispense: 90 capsule; Refill: 0  Hot flashes Assessment & Plan: Discussed nonhormonal options.  Start venlafaxine ER 37.5 mg daily. Discussed potential side effects, instructions for use. She will update in about 4 weeks.  Orders: -     Venlafaxine HCl ER; Take 1 capsule (37.5 mg total) by mouth daily with breakfast. For anxiety and hot flashes.  Dispense: 90 capsule;  Refill: 0        Doreene Nest, NP

## 2022-09-01 ENCOUNTER — Telehealth: Payer: Self-pay | Admitting: Primary Care

## 2022-09-01 NOTE — Telephone Encounter (Signed)
Have placed everything in folder for review. Did not fax off new form that you had given today not sure if it will have all information. Please advise.

## 2022-09-01 NOTE — Telephone Encounter (Signed)
Can we provide a copy for patient to have up front?

## 2022-09-01 NOTE — Telephone Encounter (Signed)
Please fax forms and include office notes from 08/30/22. Placed in Kelli's inbox

## 2022-09-01 NOTE — Telephone Encounter (Signed)
Completed paperwork and placed in Maria [name]'s inbox. Also see MyChart message.

## 2022-09-01 NOTE — Telephone Encounter (Signed)
Pt called asking for update on message sent to [facility] via MyChart. Pt requested for a call back from [facility]. Call back # [phone]

## 2022-09-01 NOTE — Telephone Encounter (Signed)
Paperwork completed. Can we fax the updated forms along with office notes? She would also like a copy.

## 2022-09-01 NOTE — Telephone Encounter (Signed)
Patient has sent another message with further request. Please see that my chart message for documentation.

## 2022-09-01 NOTE — Telephone Encounter (Signed)
Patient dropped off document FMLA, patient asked for document to be sent off via fax within ASAP. Patient would like a copy of this paperwork as well once completed. Patient has signed release form for office visit notes to be sent with FMLA as well. Please advise patient once faxed and once ready to pickup. Document placed in provider's tray in front office.

## 2022-09-02 NOTE — Telephone Encounter (Signed)
Patient has picked up copy of this ppw.

## 2022-09-14 ENCOUNTER — Telehealth: Payer: Self-pay | Admitting: Primary Care

## 2022-09-14 NOTE — Telephone Encounter (Signed)
Form placed in your inbox for review and completion.  

## 2022-09-14 NOTE — Telephone Encounter (Signed)
Patient dropped off document  return to work form , to be filled out by provider. Patient requested to send it back via Fax /call to pick up copy as well within 5-days. Document is located in providers tray at front office.Please advise at Mobile [phone] (mobile)

## 2022-09-15 NOTE — Telephone Encounter (Signed)
Form placed in Kelli's inbox

## 2022-09-15 NOTE — Telephone Encounter (Signed)
Patient dropped off document  disability form   , to be filled out by provider. Patient requested to send it back via Fax within 5-days. Document is located in providers tray at front office.Please advise at Mobile [phone] (mobile)

## 2022-09-15 NOTE — Telephone Encounter (Signed)
Completed and placed in Kelli's inbox. 

## 2022-09-16 NOTE — Telephone Encounter (Signed)
Completed intermittent FMLA paperwork and placed in Maria [name]'s inbox.

## 2022-09-24 ENCOUNTER — Ambulatory Visit: Payer: 59

## 2022-10-15 ENCOUNTER — Ambulatory Visit: Payer: 59

## 2022-11-16 ENCOUNTER — Ambulatory Visit: Payer: 59 | Admitting: Dermatology

## 2023-01-21 ENCOUNTER — Encounter: Payer: Self-pay | Admitting: Primary Care

## 2023-01-21 ENCOUNTER — Ambulatory Visit (INDEPENDENT_AMBULATORY_CARE_PROVIDER_SITE_OTHER): Payer: 59 | Admitting: Primary Care

## 2023-01-21 ENCOUNTER — Encounter: Payer: 59 | Admitting: Primary Care

## 2023-01-21 VITALS — BP 144/92 | HR 110 | Temp 99.2°F | Ht 67.0 in | Wt 195.0 lb

## 2023-01-21 DIAGNOSIS — R7303 Prediabetes: Secondary | ICD-10-CM

## 2023-01-21 DIAGNOSIS — Z0001 Encounter for general adult medical examination with abnormal findings: Secondary | ICD-10-CM | POA: Diagnosis not present

## 2023-01-21 DIAGNOSIS — R3129 Other microscopic hematuria: Secondary | ICD-10-CM | POA: Diagnosis not present

## 2023-01-21 DIAGNOSIS — I10 Essential (primary) hypertension: Secondary | ICD-10-CM

## 2023-01-21 DIAGNOSIS — B353 Tinea pedis: Secondary | ICD-10-CM | POA: Insufficient documentation

## 2023-01-21 DIAGNOSIS — R7989 Other specified abnormal findings of blood chemistry: Secondary | ICD-10-CM

## 2023-01-21 DIAGNOSIS — E559 Vitamin D deficiency, unspecified: Secondary | ICD-10-CM | POA: Insufficient documentation

## 2023-01-21 DIAGNOSIS — R051 Acute cough: Secondary | ICD-10-CM | POA: Diagnosis not present

## 2023-01-21 DIAGNOSIS — F411 Generalized anxiety disorder: Secondary | ICD-10-CM

## 2023-01-21 LAB — COMPREHENSIVE METABOLIC PANEL
ALT: 15 U/L (ref 0–35)
AST: 14 U/L (ref 0–37)
Albumin: 4.1 g/dL (ref 3.5–5.2)
Alkaline Phosphatase: 70 U/L (ref 39–117)
BUN: 13 mg/dL (ref 6–23)
CO2: 34 meq/L — ABNORMAL HIGH (ref 19–32)
Calcium: 9.2 mg/dL (ref 8.4–10.5)
Chloride: 102 meq/L (ref 96–112)
Creatinine, Ser: 1.12 mg/dL (ref 0.40–1.20)
GFR: 57.92 mL/min — ABNORMAL LOW (ref >60.00)
Glucose, Bld: 99 mg/dL (ref 70–99)
Potassium: 3.9 meq/L (ref 3.5–5.1)
Sodium: 141 meq/L (ref 135–145)
Total Bilirubin: 0.9 mg/dL (ref 0.2–1.2)
Total Protein: 6.9 g/dL (ref 6.0–8.3)

## 2023-01-21 LAB — VITAMIN D 25 HYDROXY (VIT D DEFICIENCY, FRACTURES): VITD: 22.04 ng/mL — ABNORMAL LOW (ref 30.00–100.00)

## 2023-01-21 LAB — HEMOGLOBIN A1C: Hgb A1c MFr Bld: 6.1 % (ref 4.6–6.5)

## 2023-01-21 LAB — LIPID PANEL
Cholesterol: 185 mg/dL (ref 0–200)
HDL: 45.6 mg/dL (ref >39.00)
LDL Cholesterol: 118 mg/dL — ABNORMAL HIGH (ref 0–99)
NonHDL: 139.59
Total CHOL/HDL Ratio: 4
Triglycerides: 106 mg/dL (ref 0.0–149.0)
VLDL: 21.2 mg/dL (ref 0.0–40.0)

## 2023-01-21 MED ORDER — TERBINAFINE HCL 1 % EX CREA: 1.0000 | TOPICAL_CREAM | Freq: Two times a day (BID) | CUTANEOUS | 0 refills | Status: DC

## 2023-01-21 NOTE — Assessment & Plan Note (Signed)
Urine microscopic testing from last year negative.

## 2023-01-21 NOTE — Assessment & Plan Note (Addendum)
HPI and presentation today representative viral etiology.  Continue conservative care. Discussed options with patient.

## 2023-01-21 NOTE — Assessment & Plan Note (Signed)
Not currently on supplementation.  Repeat vitamin D level pending.

## 2023-01-21 NOTE — Assessment & Plan Note (Signed)
Uncontrolled, also with inconsistent use of hydrochlorothiazide.  Resume hydrochlorothiazide 25 mg daily and move to morning administration. CMP pending.

## 2023-01-21 NOTE — Addendum Note (Signed)
Addended by: Doreene Nest on: 01/21/2023 08:12 AM   Modules accepted: Orders

## 2023-01-21 NOTE — Assessment & Plan Note (Signed)
Immunizations UTD. Declines influenza vaccine.  Pap smear UTD. Follows with GYN Mammogram due, orders placed in May 2024, discussed to call and schedule Colonoscopy UTD, due 2033  Discussed the importance of a healthy diet and regular exercise in order for weight loss, and to reduce the risk of further co-morbidity.  Exam stable. Labs pending.  Follow up in 1 year for repeat physical.

## 2023-01-21 NOTE — Assessment & Plan Note (Addendum)
Uncontrolled, also never started venlafaxine as prescribed in July 2024.  Start venlafaxine ER 37.5 mg daily for anxiety and hot flashes. She will update.  Agreed to continue FMLA upon expiration.

## 2023-01-21 NOTE — Assessment & Plan Note (Signed)
Repeat A1c pending. 

## 2023-01-21 NOTE — Progress Notes (Addendum)
Subjective:    Patient ID: Maria [name], female    DOB: 15-Jan-1976, 47 y.o.   MRN: [id number]  HPI  Maria [name] is a very pleasant 47 y.o. female who presents today for complete physical and follow up of chronic conditions.  She is also needing renewal of intermittent FMLA for her occupation in January 2025. She is on FMLA due to anxiety and stress from her occupation. She would like this renewed when due as she finds it beneficial. She hardly uses her FMLA but likes knowing that it is available.   She is inconsistent with her hydrochlorothiazide medication as it causes her to urinate. She is taking the medication at night.  She would also like to discuss acute cough. Symptom onset 2 days ago with scratchy throat. Yesterday she began noticing ear pain, cough, chills. She denies fevers, rhinorrhea, sneezing, itchy/watery eyes. She took Theraflu last night with some improvement. She works from home. Her son was slightly sick last week, came home from thanksgiving last week.   She would also like to discuss rash/skin changes. Chronic in between the right 4th and 5th toes. The rash is sometimes itchy and red. Denies pain. She's applied neosporin and Vaseline without improvement.   Immunizations: -Tetanus: Completed > 10 years.  -Influenza: Declines influenza vaccine.   Diet: Fair diet.  Exercise: No regular exercise.  Eye exam: Completes annually  Dental exam: Completes semi-annually    Pap Smear: Completed in November 2021, follows with GYN Mammogram: Completed in June 2023  Colonoscopy: Completed in 2023, due 2033   BP Readings from Last 3 Encounters:  01/21/23 (!) 144/92  08/30/22 120/82  03/24/22 (!) 145/89     Review of Systems  Constitutional:  Positive for chills and fatigue. Negative for unexpected weight change.  HENT:  Positive for rhinorrhea.   Respiratory:  Positive for cough. Negative for shortness of breath.   Cardiovascular:  Negative for chest  pain.  Gastrointestinal:  Negative for constipation and diarrhea.  Genitourinary:  Negative for difficulty urinating.  Musculoskeletal:  Negative for arthralgias and myalgias.  Skin:  Positive for color change. Negative for rash.       Whitish rash in between toes  Allergic/Immunologic: Negative for environmental allergies.  Neurological:  Negative for dizziness, numbness and headaches.  Psychiatric/Behavioral:  The patient is not nervous/anxious.          Past Medical History:  Diagnosis Date   Abnormal TSH    Cataract    Chest tightness    Dizziness    Epigastric pain 12/09/2021   History of UTI    Hypertension    IDA (iron deficiency anemia)    Palpitations    Precordial pain     Social History   Socioeconomic History   Marital status: Married    Spouse name: Not on file   Number of children: Not on file   Years of education: Not on file   Highest education level: Not on file  Occupational History   Not on file  Tobacco Use   Smoking status: Never    Passive exposure: Past   Smokeless tobacco: Never  Vaping Use   Vaping status: Never Used  Substance and Sexual Activity   Alcohol use: No   Drug use: No   Sexual activity: Yes    Birth control/protection: None  Other Topics Concern   Not on file  Social History Narrative   Married.   2 children.   Works as an Armed forces operational officer  Production designer, theatre/television/film.   Enjoys relaxing.    Social Determinants of Health   Financial Resource Strain: Not on file  Food Insecurity: Not on file  Transportation Needs: Not on file  Physical Activity: Not on file  Stress: Not on file  Social Connections: Unknown (06/30/2021)   Received from [facility], [facility]   Social Network    Social Network: Not on file  Intimate Partner Violence: Unknown (05/22/2021)   Received from [facility], [facility]   HITS    Physically Hurt: Not on file    Insult or Talk Down To: Not on file    Threaten Physical Harm: Not on file    Scream or Curse: Not  on file    Past Surgical History:  Procedure Laterality Date   ABDOMINOPLASTY  2019   CATARACT EXTRACTION Left    2021   COLONOSCOPY     UPPER GASTROINTESTINAL ENDOSCOPY      Family History  Problem Relation Age of Onset   Arthritis Maternal Grandmother    Diabetes Maternal Grandmother    Hypertension Maternal Grandmother    Throat cancer Maternal Grandfather    Birth defects Maternal Grandfather    Breast cancer Neg Hx    Colon cancer Neg Hx    Stomach cancer Neg Hx    Esophageal cancer Neg Hx    Rectal cancer Neg Hx     No Known Allergies  Current Outpatient Medications on File Prior to Visit  Medication Sig Dispense Refill   hydrochlorothiazide (HYDRODIURIL) 25 MG tablet Take 1 tablet (25 mg total) by mouth daily. for blood pressure. 90 tablet 3   venlafaxine XR (EFFEXOR XR) 37.5 MG 24 hr capsule Take 1 capsule (37.5 mg total) by mouth daily with breakfast. For anxiety and hot flashes. (Patient not taking: Reported on 01/21/2023) 90 capsule 0   No current facility-administered medications on file prior to visit.    BP (!) 144/92   Pulse (!) 110   Temp 99.2 F (37.3 C) (Temporal)   Ht 5\' 7"  (1.702 m)   Wt 195 lb (88.5 kg)   BMI 30.54 kg/m  Objective:   Physical Exam HENT:     Right Ear: Tympanic membrane and ear canal normal.     Left Ear: Tympanic membrane and ear canal normal.  Eyes:     Pupils: Pupils are equal, round, and reactive to light.  Cardiovascular:     Rate and Rhythm: Normal rate and regular rhythm.  Pulmonary:     Effort: Pulmonary effort is normal.     Breath sounds: Normal breath sounds.  Abdominal:     General: Bowel sounds are normal.     Palpations: Abdomen is soft.     Tenderness: There is no abdominal tenderness.  Musculoskeletal:        General: Normal range of motion.     Cervical back: Neck supple.  Skin:    General: Skin is warm and dry.     Findings: Rash present.     Comments: Whitish appearing fungal rash in between right  4th and 5th digits representative of tinea pedis.  Neurological:     Mental Status: She is alert and oriented to person, place, and time.     Cranial Nerves: No cranial nerve deficit.     Deep Tendon Reflexes:     Reflex Scores:      Patellar reflexes are 2+ on the right side and 2+ on the left side. Psychiatric:  Mood and Affect: Mood normal.           Assessment & Plan:  Encounter for annual general medical examination with abnormal findings in adult Assessment & Plan: Immunizations UTD. Declines influenza vaccine.  Pap smear UTD. Follows with GYN Mammogram due, orders placed in May 2024, discussed to call and schedule Colonoscopy UTD, due 2033  Discussed the importance of a healthy diet and regular exercise in order for weight loss, and to reduce the risk of further co-morbidity.  Exam stable. Labs pending.  Follow up in 1 year for repeat physical.    Tinea pedis of right foot Assessment & Plan: Exam today representative.  Start terbinafine 1% cream twice daily x 1 to 2 weeks. Also discussed good foot hygiene.  Orders: -     Terbinafine HCl; Apply 1 Application topically 2 (two) times daily.  Dispense: 30 g; Refill: 0  Essential hypertension Assessment & Plan: Uncontrolled, also with inconsistent use of hydrochlorothiazide.  Resume hydrochlorothiazide 25 mg daily and move to morning administration. CMP pending.  Orders: -     Lipid panel -     Comprehensive metabolic panel  Microscopic hematuria Assessment & Plan: Urine microscopic testing from last year negative.   Abnormal TSH Assessment & Plan: Repeat TSH pending.   GAD (generalized anxiety disorder) Assessment & Plan: Uncontrolled, also never started venlafaxine as prescribed in July 2024.  Start venlafaxine ER 37.5 mg daily for anxiety and hot flashes. She will update.  Agreed to continue FMLA upon expiration.   Vitamin D deficiency Assessment & Plan: Not currently on  supplementation. Repeat vitamin D level pending.  Orders: -     VITAMIN D 25 Hydroxy (Vit-D Deficiency, Fractures)  Acute cough Assessment & Plan: HPI and presentation today representative viral etiology.  Continue conservative care. Discussed options with patient.   Prediabetes Assessment & Plan: Repeat A1c pending.  Orders: -     Hemoglobin A1c        Doreene Nest, NP

## 2023-01-21 NOTE — Assessment & Plan Note (Signed)
Repeat TSH pending

## 2023-01-21 NOTE — Patient Instructions (Signed)
Apply the terbinafine cream twice daily for 1 to 2 weeks.  Start taking your hydrochlorothiazide blood pressure pill in the morning.  Call the breast center to schedule your mammogram.  Stop by the lab prior to leaving today. I will notify you of your results once received.   You can try a few things over the counter to help with your symptoms including:  Cough: Delsym or Robitussin (get the off brand, works just as well) Chest Congestion: Mucinex (plain) Nasal Congestion/Ear Pressure/Sinus Pressure: Try using Flonase (fluticasone) nasal spray. Instill 1 spray in each nostril twice daily. This can be purchased over the counter. Body aches, fevers, headache: Ibuprofen (not to exceed 2400 mg in 24 hours) or Acetaminophen-Tylenol (not to exceed 3000 mg in 24 hours) Runny Nose/Throat Drainage/Sneezing/Itchy or Watery Eyes: An antihistamine such as Zyrtec, Claritin, Xyzal, Allegra  You should be feeling better by day seven of symptoms, but please do contact me if this is not the case.  It was a pleasure to see you today!

## 2023-01-21 NOTE — Assessment & Plan Note (Signed)
Exam today representative.  Start terbinafine 1% cream twice daily x 1 to 2 weeks. Also discussed good foot hygiene.

## 2023-01-23 DIAGNOSIS — B353 Tinea pedis: Secondary | ICD-10-CM

## 2023-01-23 MED ORDER — CICLOPIROX OLAMINE 0.77 % EX CREA: TOPICAL_CREAM | Freq: Two times a day (BID) | CUTANEOUS | 0 refills | Status: DC

## 2023-02-09 ENCOUNTER — Emergency Department: Payer: 59

## 2023-02-09 ENCOUNTER — Emergency Department
Admission: EM | Admit: 2023-02-09 | Discharge: 2023-02-09 | Discharge status: Against Medical Advice | Payer: 59 | Attending: Emergency Medicine | Admitting: Emergency Medicine

## 2023-02-09 ENCOUNTER — Other Ambulatory Visit: Payer: Self-pay

## 2023-02-09 DIAGNOSIS — R079 Chest pain, unspecified: Secondary | ICD-10-CM | POA: Insufficient documentation

## 2023-02-09 DIAGNOSIS — Z5321 Procedure and treatment not carried out due to patient leaving prior to being seen by health care provider: Secondary | ICD-10-CM | POA: Insufficient documentation

## 2023-02-09 LAB — BASIC METABOLIC PANEL
Anion gap: 9 (ref 5–15)
BUN: 17 mg/dL (ref 6–20)
CO2: 29 mmol/L (ref 22–32)
Calcium: 9.4 mg/dL (ref 8.9–10.3)
Chloride: 101 mmol/L (ref 98–111)
Creatinine, Ser: 1.16 mg/dL — ABNORMAL HIGH (ref 0.44–1.00)
GFR, Estimated: 58 mL/min — ABNORMAL LOW (ref >60)
Glucose, Bld: 115 mg/dL — ABNORMAL HIGH (ref 70–99)
Potassium: 3.4 mmol/L — ABNORMAL LOW (ref 3.5–5.1)
Sodium: 139 mmol/L (ref 135–145)

## 2023-02-09 LAB — CBC
HCT: 39.5 % (ref 36.0–46.0)
Hemoglobin: 13.1 g/dL (ref 12.0–15.0)
MCH: 29.6 pg (ref 26.0–34.0)
MCHC: 33.2 g/dL (ref 30.0–36.0)
MCV: 89.4 fL (ref 80.0–100.0)
Platelets: 204 10*3/uL (ref 150–400)
RBC: 4.42 MIL/uL (ref 3.87–5.11)
RDW: 14.5 % (ref 11.5–15.5)
WBC: 5.3 10*3/uL (ref 4.0–10.5)
nRBC: 0 % (ref 0.0–0.2)

## 2023-02-09 LAB — TROPONIN I (HIGH SENSITIVITY): Troponin I (High Sensitivity): 3 ng/L (ref <18)

## 2023-02-09 NOTE — ED Triage Notes (Signed)
Pt sts that she comes in tonight due to chest pain while sleeping and it woke her up. Pt sts that she has had this for the last several weeks, however it is different tonight. Pt sts that she also has a cold that she is just not able to go away.

## 2023-02-15 NOTE — Telephone Encounter (Signed)

## 2023-02-18 ENCOUNTER — Encounter: Payer: 59 | Admitting: Primary Care

## 2023-02-23 ENCOUNTER — Other Ambulatory Visit: Payer: Self-pay | Admitting: Primary Care

## 2023-02-23 DIAGNOSIS — Z1231 Encounter for screening mammogram for malignant neoplasm of breast: Secondary | ICD-10-CM

## 2023-02-25 ENCOUNTER — Telehealth: Payer: Self-pay | Admitting: Primary Care

## 2023-02-25 NOTE — Telephone Encounter (Signed)
 Patient came by and dropped off FMLA paperwork. Placed in [place] box up front. Thank you!

## 2023-03-01 NOTE — Telephone Encounter (Signed)
 Completed and placed in Kelli's inbox.

## 2023-03-02 NOTE — Telephone Encounter (Signed)
 PPW has been faxed as requested

## 2023-03-04 DIAGNOSIS — Z1231 Encounter for screening mammogram for malignant neoplasm of breast: Secondary | ICD-10-CM

## 2023-03-08 NOTE — Telephone Encounter (Signed)
Forms were sent to scan after we received fax confirmation. They have not been uploaded to media yet.

## 2023-03-08 NOTE — Telephone Encounter (Signed)
Do you have a copy of her recent FMLA forms for which I completed? She is requesting a date change.

## 2023-03-09 ENCOUNTER — Telehealth: Payer: Self-pay | Admitting: Primary Care

## 2023-03-09 NOTE — Telephone Encounter (Signed)
Patient husband dropped off paperwork for patient. Placed in Kate's box up front. Thank you!

## 2023-03-09 NOTE — Telephone Encounter (Signed)
Forms placed in [place] inbox for review and completion.

## 2023-03-10 NOTE — Telephone Encounter (Signed)
Forms completed and placed in Kelli's inbox.

## 2023-03-10 NOTE — Telephone Encounter (Signed)
 PPW faxed

## 2023-03-18 ENCOUNTER — Ambulatory Visit
Admission: RE | Admit: 2023-03-18 | Discharge: 2023-03-18 | Disposition: A | Discharge status: Home / Self Care | Payer: 59 | Source: Ambulatory Visit | Attending: Primary Care | Admitting: Primary Care

## 2023-03-18 DIAGNOSIS — Z1231 Encounter for screening mammogram for malignant neoplasm of breast: Secondary | ICD-10-CM

## 2023-03-21 ENCOUNTER — Ambulatory Visit (INDEPENDENT_AMBULATORY_CARE_PROVIDER_SITE_OTHER): Payer: 59 | Admitting: Dermatology

## 2023-03-21 DIAGNOSIS — D235 Other benign neoplasm of skin of trunk: Secondary | ICD-10-CM

## 2023-03-21 DIAGNOSIS — D239 Other benign neoplasm of skin, unspecified: Secondary | ICD-10-CM

## 2023-03-21 NOTE — Progress Notes (Signed)
   Follow-Up Visit   Subjective  Maria [name] is a 48 y.o. female who presents for the following: bump L buttocks, 17m, no symptoms, no treatment The patient has spots, moles and lesions to be evaluated, some may be new or changing and the patient may have concern these could be cancer.   The following portions of the chart were reviewed this encounter and updated as appropriate: medications, allergies, medical history  Review of Systems:  No other skin or systemic complaints except as noted in HPI or Assessment and Plan.  Objective  Well appearing patient in no apparent distress; mood and affect are within normal limits.   A focused examination was performed of the following areas: buttocks  Relevant exam findings are noted in the Assessment and Plan.    Assessment & Plan   DERMATOFIBROMA vs Hypertrophic SCAR L buttock Exam: 6.24mm firm hyperpigmented papule L buttock  A dermatofibroma is a benign growth possibly related to trauma, such as an insect bite, cut from shaving, or inflamed acne-type bump.  Treatment options to remove include shave or excision with resulting scar and risk of recurrence.  Since not bothersome, will observe for now.   Treatment Plan: Benign-appearing.  Observation.  Call clinic for new or changing lesions.  Recommend daily use of broad spectrum spf 30+ sunscreen to sun-exposed areas.  Discussed Serica scar gel every day/bid  Recommend Serica moisturizing scar formula cream every night or [facility] brand or Mederma silicone scar sheet every night for the first year after a scar appears to help with scar remodeling if desired. Scars remodel on their own for a full year and will gradually improve in appearance over time.      Return if symptoms worsen or fail to improve.  I, Ardis Rowan, RMA, am acting as scribe for Willeen Niece, MD .   Documentation: I have reviewed the above documentation for accuracy and completeness, and I agree with the  above.  Willeen Niece, MD

## 2023-03-21 NOTE — Patient Instructions (Addendum)
 Recommend Serica moisturizing scar formula cream every night or [facility] brand or Mederma silicone scar sheet every night for the first year after a scar appears to help with scar remodeling if desired. Scars remodel on their own for a full year and will gradually improve in appearance over time.   Due to recent changes in healthcare laws, you may see results of your pathology and/or laboratory studies on MyChart before the doctors have had a chance to review them. We understand that in some cases there may be results that are confusing or concerning to you. Please understand that not all results are received at the same time and often the doctors may need to interpret multiple results in order to provide you with the best plan of care or course of treatment. Therefore, we ask that you please give [place] 2 business days to thoroughly review all your results before contacting the office for clarification. Should we see a critical lab result, you will be contacted sooner.   If You Need Anything After Your Visit  If you have any questions or concerns for your doctor, please call our main line at [phone] and press option 4 to reach your doctor's medical assistant. If no one answers, please leave a voicemail as directed and we will return your call as soon as possible. Messages left after 4 pm will be answered the following business day.   You may also send [place] a message via MyChart. We typically respond to MyChart messages within 1-2 business days.  For prescription refills, please ask your pharmacy to contact our office. Our fax number is [phone].  If you have an urgent issue when the clinic is closed that cannot wait until the next business day, you can page your doctor at the number below.    Please note that while we do our best to be available for urgent issues outside of office hours, we are not available 24/7.   If you have an urgent issue and are unable to reach [place], you may choose to seek  medical care at your doctor's office, retail clinic, urgent care center, or emergency room.  If you have a medical emergency, please immediately call 911 or go to the emergency department.  Pager Numbers  - Dr. Gwen Pounds: [phone]  - Dr. Roseanne Reno: [phone]  - Dr. Katrinka Blazing: [phone]   In the event of inclement weather, please call our main line at [phone] for an update on the status of any delays or closures.  Dermatology Medication Tips: Please keep the boxes that topical medications come in in order to help keep track of the instructions about where and how to use these. Pharmacies typically print the medication instructions only on the boxes and not directly on the medication tubes.   If your medication is too expensive, please contact our office at [phone] option 4 or send [place] a message through MyChart.   We are unable to tell what your co-pay for medications will be in advance as this is different depending on your insurance coverage. However, we may be able to find a substitute medication at lower cost or fill out paperwork to get insurance to cover a needed medication.   If a prior authorization is required to get your medication covered by your insurance company, please allow [place] 1-2 business days to complete this process.  Drug prices often vary depending on where the prescription is filled and some pharmacies may offer cheaper prices.  The website www.goodrx.com contains coupons for medications  through different pharmacies. The prices here do not account for what the cost may be with help from insurance (it may be cheaper with your insurance), but the website can give you the price if you did not use any insurance.  - You can print the associated coupon and take it with your prescription to the pharmacy.  - You may also stop by our office during regular business hours and pick up a GoodRx coupon card.  - If you need your prescription sent electronically to a  different pharmacy, notify our office through [facility] or by phone at [phone] option 4.     Si Usted Necesita Algo Despus de Su Visita  Tambin puede enviarnos un mensaje a travs de Clinical cytogeneticist. Por lo general respondemos a los mensajes de MyChart en el transcurso de 1 a 2 das hbiles.  Para renovar recetas, por favor pida a su farmacia que se ponga en contacto con nuestra oficina. Annie Sable de fax es [place] [phone].  Si tiene un asunto urgente cuando la clnica est cerrada y que no puede esperar hasta el siguiente da hbil, puede llamar/localizar a su doctor(a) al nmero que aparece a continuacin.   Por favor, tenga en cuenta que aunque hacemos todo lo posible para estar disponibles para asuntos urgentes fuera del horario de [place], no estamos disponibles las 24 horas del da, los 7 [address]  [address] de la [place].   Si tiene un problema urgente y no puede comunicarse con nosotros, puede optar por buscar atencin mdica  en el consultorio de su doctor(a), en una clnica privada, en un centro de atencin urgente o en una sala de emergencias.  Si tiene Engineer, drilling, por favor llame inmediatamente al 911 o vaya a la sala de emergencias.  Nmeros de bper  - Dr. Gwen Pounds: [phone]  - Dra. Roseanne Reno: [phone]  - Dr. Katrinka Blazing: [phone]   En caso de inclemencias del tiempo, por favor llame a Lacy Duverney principal al [phone] para una actualizacin sobre el [place] de cualquier retraso o cierre.  Consejos para la medicacin en dermatologa: Por favor, guarde las cajas en las que vienen los medicamentos de uso tpico para ayudarle a seguir las instrucciones sobre dnde y cmo usarlos. Las farmacias generalmente imprimen las instrucciones del medicamento slo en las cajas y no directamente en los tubos del [place].   Si su medicamento es muy caro, por favor, pngase en contacto con Rolm Gala llamando al [phone] y presione la opcin 4 o envenos un  mensaje a travs de Clinical cytogeneticist.   No podemos decirle cul ser su copago por los medicamentos por adelantado ya que esto es diferente dependiendo de la cobertura de su seguro. Sin embargo, es posible que podamos encontrar un medicamento sustituto a Audiological scientist un formulario para que el seguro cubra el medicamento que se considera necesario.   Si se requiere una autorizacin previa para que su compaa de seguros [place] su medicamento, por favor permtanos de 1 a 2 das hbiles para completar [address]  Los precios de los medicamentos varan con frecuencia dependiendo del Environmental consultant de dnde se surte la receta y alguna farmacias pueden ofrecer precios ms baratos.  El sitio web www.goodrx.com tiene cupones para medicamentos de Health and safety inspector. Los precios aqu no tienen en cuenta lo que podra costar con la ayuda del seguro (puede ser ms barato con su seguro), pero el sitio web puede darle el precio si no utiliz Tourist information centre manager.  - Puede imprimir el cupn correspondiente y  llevarlo con su receta a la farmacia.  - Tambin puede pasar por nuestra oficina durante el horario de atencin regular y Education officer, museum una tarjeta de cupones de GoodRx.  - Si necesita que su receta se enve electrnicamente a una farmacia diferente, informe a nuestra oficina a travs de MyChart de [facility] o por telfono llamando al [phone] y presione la opcin 4.

## 2023-03-24 ENCOUNTER — Other Ambulatory Visit: Payer: Self-pay | Admitting: Primary Care

## 2023-03-24 DIAGNOSIS — B353 Tinea pedis: Secondary | ICD-10-CM

## 2023-03-24 MED ORDER — CICLOPIROX OLAMINE 0.77 % EX CREA: TOPICAL_CREAM | Freq: Two times a day (BID) | CUTANEOUS | 0 refills | Status: DC

## 2023-05-19 ENCOUNTER — Encounter (HOSPITAL_BASED_OUTPATIENT_CLINIC_OR_DEPARTMENT_OTHER): Payer: Self-pay | Admitting: Family

## 2023-05-19 ENCOUNTER — Ambulatory Visit (HOSPITAL_BASED_OUTPATIENT_CLINIC_OR_DEPARTMENT_OTHER): Payer: 59 | Admitting: Family

## 2023-05-19 VITALS — BP 128/78 | HR 87 | Ht 67.0 in | Wt 188.9 lb

## 2023-05-19 DIAGNOSIS — I10 Essential (primary) hypertension: Secondary | ICD-10-CM

## 2023-05-19 DIAGNOSIS — R0683 Snoring: Secondary | ICD-10-CM

## 2023-05-19 MED ORDER — NIFEDIPINE ER OSMOTIC RELEASE 30 MG PO TB24: 30.0000 mg | ORAL_TABLET | Freq: Every day | ORAL | 11 refills | Status: DC

## 2023-05-19 NOTE — Progress Notes (Signed)
 [facility] Initial Assessment:    Date:  05/19/2023   ID:  Maria [name], DOB 29-Aug-1975, MRN [id number]  PCP:  Doreene Nest, NP  Cardiologist:  Jodelle Red, MD  Nephrologist:  Referring MD: Maxie Better, MD   CC: Hypertension  History of Present Illness:    Maria [name] is a 48 y.o. female with a hx of hypertension, GAD, vitamin D deficiency, prediabetes here to establish care in the [facility].   Seen by primary care provider 08/30/22 noted inconsistent use of hydrochlorothiazide, recommended to resume regular use.   Discussed the use of AI scribe software for clinical note transcription with the patient, who gave verbal consent to proceed.  History of Present Illness Maria [name], with a history of hypertension, presents for a consultation regarding her high blood pressure. She has been monitoring her blood pressure at home, with readings typically in the 140s when checked with arm cuff. She reports frequent urination, especially when taking hydrochlorothiazide, which she admits to taking more consistently during the week than on weekends due to the increased urination. Reports inconsistent sleep patterns, often waking up in the middle of the night, and occasionally takes sleep aids such as Z-Quil. No prior tobacco use. She expresses concern about the potential impact of blood pressure medication on her kidneys. Notes to eating out frequently and acknowledges the potential impact of dietary salt on her blood pressure. She reports engaging in regular exercise, typically walking during her lunch break.   Previous antihypertensives: Amlodipine- felt "off"   Past Medical History:  Diagnosis Date   Abnormal TSH    Cataract    Chest tightness    Dizziness    Epigastric pain 12/09/2021   History of UTI    Hypertension    IDA (iron deficiency anemia)    Palpitations    Precordial pain     Past Surgical  History:  Procedure Laterality Date   ABDOMINOPLASTY  2019   CATARACT EXTRACTION Left    2021   COLONOSCOPY     UPPER GASTROINTESTINAL ENDOSCOPY      Current Medications: Current Meds  Medication Sig   ciclopirox (LOPROX) 0.77 % cream Apply topically 2 (two) times daily.   NIFEdipine (PROCARDIA-XL/NIFEDICAL-XL) 30 MG 24 hr tablet Take 1 tablet (30 mg total) by mouth daily.   [DISCONTINUED] hydrochlorothiazide (HYDRODIURIL) 25 MG tablet Take 1 tablet (25 mg total) by mouth daily. for blood pressure.     Allergies:   Patient has no known allergies.   Social History   Socioeconomic History   Marital status: Married    Spouse name: Not on file   Number of children: Not on file   Years of education: Not on file   Highest education level: Not on file  Occupational History   Not on file  Tobacco Use   Smoking status: Never    Passive exposure: Past   Smokeless tobacco: Never  Vaping Use   Vaping status: Never Used  Substance and Sexual Activity   Alcohol use: No   Drug use: No   Sexual activity: Yes    Birth control/protection: None  Other Topics Concern   Not on file  Social History Narrative   Married.   2 children.   Works as an Scientist, water quality.   Enjoys relaxing.    Social Drivers of Corporate investment banker Strain: Not on file  Food Insecurity: No Food Insecurity (05/19/2023)   Hunger Vital Sign  Worried About Programme researcher, broadcasting/film/video in the Last Year: Never true    Ran Out of Food in the Last Year: Never true  Transportation Needs: No Transportation Needs (05/19/2023)   PRAPARE - Administrator, Civil Service (Medical): No    Lack of Transportation (Non-Medical): No  Physical Activity: Insufficiently Active (05/19/2023)   Exercise Vital Sign    Days of Exercise per Week: 4 days    Minutes of Exercise per Session: 20 min  Stress: Not on file  Social Connections: Unknown (06/30/2021)   Received [facility]   Social Network     Social Network: Not on file     Family History: The patient's family history includes Arthritis in her maternal grandmother; Birth defects in her maternal grandfather; Diabetes in her maternal grandmother; Hypertension in her maternal grandmother; Throat cancer in her maternal grandfather. There is no history of Breast cancer, Colon cancer, Stomach cancer, Esophageal cancer, or Rectal cancer.  ROS:   Please see the history of present illness.     All other systems reviewed and are negative.  EKGs/Labs/Other Studies Reviewed:         Recent Labs: 01/21/2023: ALT 15 02/09/2023: BUN 17; Creatinine, Ser 1.16; Hemoglobin 13.1; Platelets 204; Potassium 3.4; Sodium 139   Recent Lipid Panel    Component Value Date/Time   CHOL 185 01/21/2023 0817   CHOL 180 12/13/2016 1156   TRIG 106.0 01/21/2023 0817   HDL 45.60 01/21/2023 0817   HDL 56 12/13/2016 1156   CHOLHDL 4 01/21/2023 0817   VLDL 21.2 01/21/2023 0817   LDLCALC 118 (H) 01/21/2023 0817   LDLCALC 141 (H) 01/15/2022 1452    Physical Exam:   VS:  BP 128/78 (BP Location: Right Arm, Patient Position: Sitting, Cuff Size: Large)   Pulse 87   Ht 5\' 7"  (1.702 m)   Wt 188 lb 14.4 oz (85.7 kg)   SpO2 100%   BMI 29.59 kg/m  , BMI Body mass index is 29.59 kg/m.  Vitals:   05/19/23 0821 05/19/23 0825  BP: (!) 150/84 128/78  Pulse: 87 87  Height: 5\' 7"  (1.702 m)   Weight: 188 lb 14.4 oz (85.7 kg)   SpO2: 100%   BMI (Calculated): 29.58       GENERAL:  Well appearing HEENT: Pupils equal round and reactive, fundi not visualized, oral mucosa unremarkable NECK:  No jugular venous distention, waveform within normal limits, carotid upstroke brisk and symmetric, no bruits, no thyromegaly LYMPHATICS:  No cervical adenopathy LUNGS:  Clear to auscultation bilaterally HEART:  RRR.  PMI not displaced or sustained,S1 and S2 within normal limits, no S3, no S4, no clicks, no rubs, no murmurs ABD:  Flat, positive bowel sounds normal in  frequency in pitch, no bruits, no rebound, no guarding, no midline pulsatile mass, no hepatomegaly, no splenomegaly EXT:  2 plus pulses throughout, no edema, no cyanosis no clubbing SKIN:  No rashes no nodules NEURO:  Cranial nerves II through XII grossly intact, motor grossly intact throughout PSYCH:  Cognitively intact, oriented to person place and time   ASSESSMENT/PLAN:    Assessment & Plan Hypertension Hypertension for 2-3 years with inconsistent hydrochlorothiazide use due to side effects. Discussed sodium intake and potential causes like sleep apnea and hormonal imbalances. Initiated nifedipine for efficacy and adherence benefits. - Stop hydrochlorothiazide due to urinary frequency. Update BMET.  - Start nifedipine 30 mg once daily. - If does not tolerate, could instead trial Olmesartan for  BP control.  - Provided educational materials on low sodium diet. - Order blood work for secondary hypertension evaluation: catecholamines, metanephrines, renin-aldosterone - Order Itamar sleep study for sleep apnea assessment. - Advise bringing home BP cuff to assess accuracy.  Suspected Sleep Apnea Suspected due to snoring and poor sleep quality. Discussed impact on hypertension and health. Explained home sleep study benefits and insurance preference. - Order home sleep study with WatchPAT device.    Screening for Secondary Hypertension:     Relevant Labs/Studies:    Latest Ref Rng & Units 02/09/2023   12:40 AM 01/21/2023    8:17 AM 01/15/2022    2:52 PM  Basic Labs  Sodium 135 - 145 mmol/L 139  141  143   Potassium 3.5 - 5.1 mmol/L 3.4  3.9  3.8   Creatinine 0.44 - 1.00 mg/dL 1.61  0.96  0.45        Latest Ref Rng & Units 12/31/2020    8:44 AM 08/15/2020    9:48 AM  Thyroid   TSH 0.35 - 5.50 uIU/mL 0.65  0.64                   Disposition:    FU with MD/APP/PharmD in 2-3 months    Medication Adjustments/Labs and Tests Ordered: Current medicines are reviewed at length  with the patient today.  Concerns regarding medicines are outlined above.  Orders Placed This Encounter  Procedures   Basic metabolic panel with GFR   Aldosterone + renin activity w/ ratio   Catecholamines, fractionated, plasma   Metanephrines, plasma   Itamar Sleep Study   Meds ordered this encounter  Medications   NIFEdipine (PROCARDIA-XL/NIFEDICAL-XL) 30 MG 24 hr tablet    Sig: Take 1 tablet (30 mg total) by mouth daily.    Dispense:  30 tablet    Refill:  [address]     Signed, Alver Sorrow, NP  05/19/2023 12:48 PM    [facility]

## 2023-05-19 NOTE — Patient Instructions (Signed)
 Medication Instructions:  Your physician has recommended you make the following change in your medication:   Stop: hydrochlorothiazide   Start: nifedipine 30mg  daily    Labwork: Your physician recommends that you return for lab work today- BMP, renin-aldosterone, catecholamines, metanephrines   Testing/Procedures: WatchPAT?  Is a FDA cleared portable home sleep study test that uses a watch and 3 points of contact to monitor 7 different channels, including your heart rate, oxygen saturations, body position, snoring, and chest motion.  The study is easy to use from the comfort of your own home and accurately detect sleep apnea.  Before bed, you attach the chest sensor, attached the sleep apnea bracelet to your nondominant hand, and attach the finger probe.  After the study, the raw data is downloaded from the watch and scored for apnea events.   For more information: [email or web address]  Patient Testing Instructions:  Do not put battery into the device until bedtime when you are ready to begin the test. Please call the support number if you need assistance after following the instructions below: 24 hour support line- [phone] or ITAMAR support at [phone] (option 2)  Download the IntelWatchPAT One" app through the google play store or App Store  Be sure to turn on or enable access to bluetooth in settlings on your smartphone/ device  Make sure no other bluetooth devices are on and within the vicinity of your smartphone/ device and WatchPAT watch during testing.  Make sure to leave your smart phone/ device plugged in and charging all night.  When ready for bed:  Follow the instructions step by step in the WatchPAT One App to activate the testing device. For additional instructions, including video instruction, visit the WatchPAT One video on Youtube. You can search for WatchPat One within Youtube (video is 4 minutes and 18 seconds) or enter:  https://youtube/[email or web address]?[email or web address]_[email or web address] Please note: You will be prompted to enter a Pin to connect via bluetooth when starting the test. The PIN will be assigned to you when you receive the test.  The device is disposable, but it recommended that you retain the device until you receive a call letting you know the study has been received and the results have been interpreted.  We will let you know if the study did not transmit to [place] properly after the test is completed. You do not need to call us to confirm the receipt of the test.  Please complete the test within 48 hours of receiving PIN.   Frequently Asked Questions:  What is Watch Dennie Bible one?  A single use fully disposable home sleep apnea testing device and will not need to be returned after completion.  What are the requirements to use WatchPAT one?  The be able to have a successful watchpat one sleep study, you should have your Watch pat one device, your smart phone, watch pat one app, your PIN number and Internet access What type of phone do I need?  You should have a smart phone that uses Android 5.1 and above or any Iphone with IOS 10 and above How can I download the WatchPAT one app?  Based on your device type search for WatchPAT one app either in google play for android devices or APP store for Iphone's Where will I get my PIN for the study?  Your PIN will be provided by your physician's office. It is used for authentication and if you lose/forget your PIN, please reach out to your providers office.  I do not have Internet  at home. Can I do WatchPAT one study?  WatchPAT One needs Internet connection throughout the night to be able to transmit the sleep data. You can use your home/local internet or your cellular's data package. However, it is always recommended to use home/local Internet. It is estimated that between 20MB-30MB will be used with each study.However, the application will be looking for space in the phone to start the study.   What happens if I lose internet or bluetooth connection?  During the internet disconnection, your phone will not be able to transmit the sleep data. All the data, will be stored in your phone. As soon as the internet connection is back on, the phone will being sending the sleep data. During the bluetooth disconnection, WatchPAT one will not be able to to send the sleep data to your phone. Data will be kept in the [facility] one until two devices have bluetooth connection back on. As soon as the connection is back on, WatchPAT one will send the sleep data to the phone.  How long do I need to wear the WatchPAT one?  After you start the study, you should wear the device at least 6 hours.  How far should I keep my phone from the device?  During the night, your phone should be within 15 feet.  What happens if I leave the room for restroom or other reasons?  Leaving the room for any reason will not cause any problem. As soon as your get back to the room, both devices will reconnect and will continue to send the sleep data. Can I use my phone during the sleep study?  Yes, you can use your phone as usual during the study. But it is recommended to put your watchpat one on when you are ready to go to bed.  How will I get my study results?  A soon as you completed your study, your sleep data will be sent to the provider. They will then share the results with you when they are ready.    Follow-Up: 2-3 months in [facility] with Dr. Duke Salvia, Gillian Shields, or Phillips Hay    Special Instructions:  We will send you a message in two weeks to check on BP

## 2023-05-23 ENCOUNTER — Telehealth: Payer: Self-pay

## 2023-05-23 DIAGNOSIS — I10 Essential (primary) hypertension: Secondary | ICD-10-CM

## 2023-05-23 DIAGNOSIS — R0683 Snoring: Secondary | ICD-10-CM

## 2023-05-23 NOTE — Telephone Encounter (Signed)
**Note De-Identified Gwenevere Goga Obfuscation** Ordering provider: Gillian Shields, NP Associated diagnoses: Snoring-R06.83 and Somnolence-R40.0  WatchPAT PA obtained on 05/23/2023 by Allisa Einspahr, Lorelle Formosa, LPN. Authorization: Per the [facility] Provider Portal: Prior Authorization/Notification is not required for the requested service(s). Decision ID #: [id number]  Patient notified of PIN (1234) on 05/23/2023 Gerianne Simonet Notification Method: phone.  Phone note routed to covering staff for follow-up.

## 2023-06-01 LAB — CATECHOLAMINES, FRACTIONATED, PLASMA
Dopamine: <30 pg/mL (ref 0–48)
Epinephrine: <15 pg/mL (ref 0–62)
Norepinephrine: 948 pg/mL — ABNORMAL HIGH (ref 0–874)

## 2023-06-01 LAB — BASIC METABOLIC PANEL WITH GFR
BUN/Creatinine Ratio: 15 (ref 9–23)
BUN: 17 mg/dL (ref 6–24)
CO2: 26 mmol/L (ref 20–29)
Calcium: 10.5 mg/dL — ABNORMAL HIGH (ref 8.7–10.2)
Chloride: 100 mmol/L (ref 96–106)
Creatinine, Ser: 1.1 mg/dL — ABNORMAL HIGH (ref 0.57–1.00)
Glucose: 94 mg/dL (ref 70–99)
Potassium: 4.2 mmol/L (ref 3.5–5.2)
Sodium: 142 mmol/L (ref 134–144)
eGFR: 62 mL/min/{1.73_m2} (ref >59)

## 2023-06-01 LAB — METANEPHRINES, PLASMA
Metanephrine, Free: <25 pg/mL (ref 0.0–88.0)
Normetanephrine, Free: 164.8 pg/mL (ref 0.0–218.9)

## 2023-06-01 LAB — ALDOSTERONE + RENIN ACTIVITY W/ RATIO
Aldos/Renin Ratio: 22.5 (ref 0.0–30.0)
Aldosterone: 10.1 ng/dL (ref 0.0–30.0)
Renin Activity, Plasma: 0.449 ng/mL/h (ref 0.167–5.380)

## 2023-06-02 ENCOUNTER — Encounter (HOSPITAL_BASED_OUTPATIENT_CLINIC_OR_DEPARTMENT_OTHER): Payer: Self-pay

## 2023-06-02 DIAGNOSIS — I10 Essential (primary) hypertension: Secondary | ICD-10-CM

## 2023-06-02 NOTE — Addendum Note (Signed)
 Addended by: Guss Legacy on: 06/02/2023 02:24 PM   Modules accepted: Orders

## 2023-06-03 NOTE — Telephone Encounter (Signed)
 Itamar not completed or returned, routing to billing

## 2023-06-09 ENCOUNTER — Encounter (HOSPITAL_BASED_OUTPATIENT_CLINIC_OR_DEPARTMENT_OTHER): Payer: Self-pay

## 2023-06-15 ENCOUNTER — Encounter (INDEPENDENT_AMBULATORY_CARE_PROVIDER_SITE_OTHER): Payer: Self-pay | Admitting: Cardiology

## 2023-06-15 DIAGNOSIS — G4733 Obstructive sleep apnea (adult) (pediatric): Secondary | ICD-10-CM | POA: Diagnosis not present

## 2023-06-16 ENCOUNTER — Ambulatory Visit: Attending: Family

## 2023-06-16 DIAGNOSIS — R0683 Snoring: Secondary | ICD-10-CM

## 2023-06-16 DIAGNOSIS — I10 Essential (primary) hypertension: Secondary | ICD-10-CM

## 2023-06-16 NOTE — Procedures (Signed)
   SLEEP STUDY REPORT Patient Information Study Date: 06/15/2023 Patient Name: Maria [name] Patient ID: [id number] Birth Date: 03/31/1975 Age: 48 Gender: Female BMI: 29.4 (W=187 lb, H=5' 7'') Referring Physician: Neomi Banks, NP  TEST DESCRIPTION: Home sleep apnea testing was completed using the WatchPat, a Type 1 device, utilizing peripheral arterial tonometry (PAT), chest movement, actigraphy, pulse oximetry, pulse rate, body position and snore. AHI was calculated with apnea and hypopnea using valid sleep time as the denominator. RDI includes apneas, hypopneas, and RERAs. The data acquired and the scoring of sleep and all associated events were performed in accordance with the recommended standards and specifications as outlined in the AASM Manual for the Scoring of Sleep and Associated Events 2.2.0 (2015).  FINDINGS:  1. Mild Obstructive Sleep Apnea with AHI 11.3/hr overall and moderate during REM sleep with REM AHI 26.1hr..  2. No Central Sleep Apnea with pAHIc 0/hr.  3. Oxygen desaturations as low as 83%.  4. Mild to moderate snoring was present. O2 sats were < 88% for 2 min.  5. Total sleep time was 5 hrs and 23 min.  6. 23.8% of total sleep time was spent in REM sleep.  7. Normal sleep onset latency at 20 min.  8. Prolonged REM sleep onset latency at 100 min.  9. Total awakenings were 6. 10. Arrhythmia detection: None  DIAGNOSIS: Mild to moderate Obstructive Sleep Apnea (G47.33)  RECOMMENDATIONS: 1. Clinical correlation of these findings is necessary. The decision to treat obstructive sleep apnea (OSA) is usually based on the presence of apnea symptoms or the presence of associated medical conditions such as Hypertension, Congestive Heart Failure, Atrial Fibrillation or Obesity. The most common symptoms of OSA are snoring, gasping for breath while sleeping, daytime sleepiness and fatigue. 2. Initiating apnea therapy is recommended given the presence of symptoms  and/or associated conditions. Recommend proceeding with one of the following:  a. Auto-CPAP therapy with a pressure range of 5-20cm H2O.  b. An oral appliance (OA) that can be obtained from certain dentists with expertise in sleep medicine. These are primarily of use in non-obese patients with mild and moderate disease.  c. An ENT consultation which may be useful to look for specific causes of obstruction and possible treatment options.  d. If patient is intolerant to PAP therapy, consider referral to ENT for evaluation for hypoglossal nerve stimulator. 3. Close follow-up is necessary to ensure success with CPAP or oral appliance therapy for maximum benefit . 4. A follow-up oximetry study on CPAP is recommended to assess the adequacy of therapy and determine the need for supplemental oxygen or the potential need for Bi-level therapy. An arterial blood gas to determine the adequacy of baseline ventilation and oxygenation should also be considered. 5. Healthy sleep recommendations include: adequate nightly sleep (normal 7-9 hrs/night), avoidance of caffeine after noon and alcohol near bedtime, and maintaining a sleep environment that is cool, dark and quiet. 6. Weight loss for overweight patients is recommended. Even modest amounts of weight loss can significantly improve the severity of sleep apnea. 7. Snoring recommendations include: weight loss where appropriate, side sleeping, and avoidance of alcohol before bed. 8. Operation of motor vehicle should be avoided when sleepy.  Signature: Gaylyn Keas, MD; [facility]; Diplomat, American Board of Sleep Medicine Electronically Signed: 06/16/2023 2:58:37 PM

## 2023-06-23 ENCOUNTER — Telehealth: Payer: Self-pay

## 2023-06-23 DIAGNOSIS — R0683 Snoring: Secondary | ICD-10-CM

## 2023-06-23 DIAGNOSIS — I10 Essential (primary) hypertension: Secondary | ICD-10-CM

## 2023-06-23 DIAGNOSIS — G4733 Obstructive sleep apnea (adult) (pediatric): Secondary | ICD-10-CM

## 2023-06-23 DIAGNOSIS — R519 Headache, unspecified: Secondary | ICD-10-CM

## 2023-06-23 DIAGNOSIS — R002 Palpitations: Secondary | ICD-10-CM

## 2023-06-23 NOTE — Telephone Encounter (Signed)
 Left VM with callback number for patient to receive sleep study results and recommendations.

## 2023-06-23 NOTE — Telephone Encounter (Signed)
-----   Message from Gaylyn Keas sent at 06/16/2023  3:00 PM EDT ----- Please let patient know that they have sleep apnea.  Recommend therapeutic CPAP titration for treatment of patient's sleep disordered breathing.

## 2023-07-07 NOTE — Addendum Note (Signed)
 Addended by: Maceo Sax on: 07/07/2023 01:57 PM   Modules accepted: Orders

## 2023-07-07 NOTE — Telephone Encounter (Signed)
 Notified patient of sleep study results and recommendations. All questions (if any) were answered and patient verbalized understanding. CPAP Titration ordered today.

## 2023-07-07 NOTE — Telephone Encounter (Signed)
 Patient returned RN's call.

## 2023-07-12 NOTE — Telephone Encounter (Signed)
**Note De-Identified Laurabeth Yip Obfuscation** I started a CPAP Titration PA through the [facility] Provider Portal: PENDING REVIEW Tracking #: [id number]

## 2023-07-16 ENCOUNTER — Encounter (HOSPITAL_BASED_OUTPATIENT_CLINIC_OR_DEPARTMENT_OTHER): Payer: Self-pay

## 2023-07-18 ENCOUNTER — Telehealth: Payer: Self-pay

## 2023-07-18 NOTE — Telephone Encounter (Signed)
**Note De-Identified Joseh Sjogren Obfuscation** I checked the [facility] Provider Portal and this CPAP Titration was cancelled.   I called [employer] and s/w Adriana Hopping who advised me that the PA was denied because "it is not medically necessary" but could not explain why it is not medically necessary. Per my request Adriana Hopping stated that she will be faxing the denial letter to us  so I can get a better understanding of this denial and can advise Dr Micael Adas.

## 2023-07-18 NOTE — Telephone Encounter (Signed)
**Note De-identified  Woolbright Obfuscation** Please advise 

## 2023-07-18 NOTE — Telephone Encounter (Signed)
 Maria [name]

## 2023-07-26 NOTE — Telephone Encounter (Addendum)
 Opened in ERROR

## 2023-07-26 NOTE — Telephone Encounter (Signed)
**Note De-Identified Shenicka Sunderlin Obfuscation** I never received the denial letter from [facility] so I called them yesterday and requested that they fax it to us  again and they did.  The letter states that they have denied coverage of the pts CPAP Titration because the pt does not have a medical condition that would require a sleep test with a attendant watching. The pt maybe eligible for a home trail of a device to help keep her airway open when she sleeps. Reference #: [id number]  Forwarding this note to Dr Micael Adas and her Sleep Coordinator.

## 2023-07-28 ENCOUNTER — Encounter (HOSPITAL_BASED_OUTPATIENT_CLINIC_OR_DEPARTMENT_OTHER): Admitting: Family

## 2023-08-01 ENCOUNTER — Telehealth: Payer: Self-pay

## 2023-08-01 DIAGNOSIS — I10 Essential (primary) hypertension: Secondary | ICD-10-CM

## 2023-08-01 DIAGNOSIS — R002 Palpitations: Secondary | ICD-10-CM

## 2023-08-01 DIAGNOSIS — G4733 Obstructive sleep apnea (adult) (pediatric): Secondary | ICD-10-CM

## 2023-08-01 DIAGNOSIS — R519 Headache, unspecified: Secondary | ICD-10-CM

## 2023-08-01 DIAGNOSIS — R0683 Snoring: Secondary | ICD-10-CM

## 2023-08-01 NOTE — Telephone Encounter (Signed)
 Left VM, per DPR, notified patient that order for ResMed CPAP on auto from 4 to 15cm H2O with heated humidity, mask of choice sent to [facility] company. Left callback  number for questions and/or concerns.

## 2023-08-05 ENCOUNTER — Ambulatory Visit: Payer: Self-pay | Admitting: Primary Care

## 2023-08-05 NOTE — Telephone Encounter (Signed)
 FYI Only or Action Required?: FYI only for provider.  Patient was last seen in primary care on 01/21/2023 by Gabriel John, NP. Called Nurse Triage reporting Foot Swelling. Symptoms began several days ago.  Symptoms are: unchanged.  Triage Disposition: See PCP When Office is Open (Within 3 Days)                       Patient/caregiver understands and will follow disposition?: YesCopied from CRM [id number]. Topic: Clinical - Red Word Triage >> Aug 05, 2023  2:26 PM Kita Perish H wrote: [facility] that prompted transfer to Nurse Triage: Foot redness and swelling, right mid back pain Reason for Disposition  MILD or MODERATE ankle swelling (e.g., can't move joint normally, can't do usual activities) (Exceptions: Itchy, localized swelling; swelling is chronic.)  Answer Assessment - Initial Assessment Questions This RN scheduled patient for an appointment on Monday, 6/23, with a different provider as no availability with PCP in office. This RN educated pt on new-worsening symptoms and when to call back/seek emergent care. Pt verbalized understanding and agrees to plan.    Bilateral feet swelling and redness  A little swollen Noticed the other day Denies pain, chest pain, difficulty breathing, numbness Pt states she sits all day for walk Right mid back pain intermittent with standing  Protocols used: Ankle Swelling-A-AH

## 2023-08-05 NOTE — Telephone Encounter (Signed)
 Noted, will evaluate.

## 2023-08-08 ENCOUNTER — Ambulatory Visit: Admitting: General Practice

## 2023-08-09 ENCOUNTER — Encounter: Payer: Self-pay | Admitting: Primary Care

## 2023-08-09 ENCOUNTER — Ambulatory Visit
Admission: RE | Admit: 2023-08-09 | Discharge: 2023-08-09 | Disposition: A | Discharge status: Home / Self Care | Source: Ambulatory Visit | Attending: Primary Care

## 2023-08-09 ENCOUNTER — Ambulatory Visit (INDEPENDENT_AMBULATORY_CARE_PROVIDER_SITE_OTHER): Admitting: Primary Care

## 2023-08-09 VITALS — BP 130/82 | HR 91 | Temp 97.2°F | Ht 67.0 in | Wt 188.0 lb

## 2023-08-09 DIAGNOSIS — M546 Pain in thoracic spine: Secondary | ICD-10-CM

## 2023-08-09 DIAGNOSIS — R600 Localized edema: Secondary | ICD-10-CM

## 2023-08-09 DIAGNOSIS — G8929 Other chronic pain: Secondary | ICD-10-CM

## 2023-08-09 DIAGNOSIS — F411 Generalized anxiety disorder: Secondary | ICD-10-CM | POA: Diagnosis not present

## 2023-08-09 HISTORY — DX: Localized edema: R60.0

## 2023-08-09 LAB — BASIC METABOLIC PANEL WITH GFR
BUN: 15 mg/dL (ref 6–23)
CO2: 33 meq/L — ABNORMAL HIGH (ref 19–32)
Calcium: 9.4 mg/dL (ref 8.4–10.5)
Chloride: 102 meq/L (ref 96–112)
Creatinine, Ser: 1.15 mg/dL (ref 0.40–1.20)
GFR: 55.9 mL/min — ABNORMAL LOW (ref >60.00)
Glucose, Bld: 88 mg/dL (ref 70–99)
Potassium: 4.1 meq/L (ref 3.5–5.1)
Sodium: 139 meq/L (ref 135–145)

## 2023-08-09 LAB — BRAIN NATRIURETIC PEPTIDE: Pro B Natriuretic peptide (BNP): 12 pg/mL (ref 0.0–100.0)

## 2023-08-09 NOTE — Progress Notes (Addendum)
 Subjective:    Patient ID: Maria [name], female    DOB: January 01, 1976, 48 y.o.   MRN: [id number]  Back Pain Pertinent negatives include no chest pain or numbness.    Maria [name] is a very pleasant 48 y.o. female with history of hypertension, GAD, prediabetes, palpitations who presents today to discuss several concerns.  1) Pedal Edema: Acute for the last few days. She was cooking in the kitchen a few nights ago and she noticed swelling and redness to her feet. The following morning her symptoms resolved. Since then her swelling has occurred every evening which is always noticeable while cooking in the kitchen.  She does have chronic swelling to the bilateral malleolus region of her ankles.  She was initiated on nifedipine  per cardiology in April 2025.  Her swelling never moves up lower extremities.  She does sit at a desk for most of her workday, tries to get up to move several times daily.  She does not elevate her legs while working.  2) Back Pain: Chronic to the right thoracic region for which has been around for at least 1 year. Over the last few months she's noticed more frequent. She describes her pain as a discomfort.   Her back pain is worse while cooking/cleaning in the kitchen after work. She is sedentary all of her work day, sits at a desk. Her pain is improved with sitting.   She denies injury/trauma, rashes. She doesn't take medication for her symptoms. She has tried getting up more frequently from her desk to stretch.  She has not tried massage therapy.  3) GAD: Currently prescribed venlafaxine  ER 37.5 mg daily, this was initiated in July 2024.  During her last visit in December 2024 she had not started the venlafaxine  ER 37.5 mg, but given uncontrolled anxiety it was recommended she do start treatment.  Today she discusses that she began the venlafaxine  ER 37.5 mg for 2 days but she stopped due to nausea.   She would like to renew her intermittent FMLA due to  anxiety and stress at work. She is seeing a therapist twice monthly which does help. Overall she feels well managed.  She is not interested in medication to treat her anxiety for now.   Review of Systems  Respiratory:  Negative for shortness of breath.   Cardiovascular:  Positive for leg swelling. Negative for chest pain.  Musculoskeletal:  Positive for back pain.  Skin:  Negative for color change and rash.  Neurological:  Negative for numbness.         Past Medical History:  Diagnosis Date   Abnormal TSH    Cataract    Chest tightness    Dizziness    Epigastric pain 12/09/2021   History of UTI    Hypertension    IDA (iron deficiency anemia)    Palpitations    Precordial pain     Social History   Socioeconomic History   Marital status: Married    Spouse name: Not on file   Number of children: Not on file   Years of education: Not on file   Highest education level: Not on file  Occupational History   Not on file  Tobacco Use   Smoking status: Never    Passive exposure: Past   Smokeless tobacco: Never  Vaping Use   Vaping status: Never Used  Substance and Sexual Activity   Alcohol use: No   Drug use: No   Sexual activity: Yes  Birth control/protection: None  Other Topics Concern   Not on file  Social History Narrative   Married.   2 children.   Works as an Scientist, water quality.   Enjoys relaxing.    Social Drivers of Corporate investment banker Strain: Not on file  Food Insecurity: No Food Insecurity (05/19/2023)   Hunger Vital Sign    Worried About Running Out of Food in the Last Year: Never true    Ran Out of Food in the Last Year: Never true  Transportation Needs: No Transportation Needs (05/19/2023)   PRAPARE - Administrator, Civil Service (Medical): No    Lack of Transportation (Non-Medical): No  Physical Activity: Insufficiently Active (05/19/2023)   Exercise Vital Sign    Days of Exercise per Week: 4 days    Minutes of Exercise per  Session: 20 min  Stress: Not on file  Social Connections: Unknown (06/30/2021)   Received from [facility]   Social Network    Social Network: Not on file  Intimate Partner Violence: Unknown (05/22/2021)   Received from [facility]   HITS    Physically Hurt: Not on file    Insult or Talk Down To: Not on file    Threaten Physical Harm: Not on file    Scream or Curse: Not on file    Past Surgical History:  Procedure Laterality Date   ABDOMINOPLASTY  2019   CATARACT EXTRACTION Left    2021   COLONOSCOPY     UPPER GASTROINTESTINAL ENDOSCOPY      Family History  Problem Relation Age of Onset   Arthritis Maternal Grandmother    Diabetes Maternal Grandmother    Hypertension Maternal Grandmother    Throat cancer Maternal Grandfather    Birth defects Maternal Grandfather    Breast cancer Neg Hx    Colon cancer Neg Hx    Stomach cancer Neg Hx    Esophageal cancer Neg Hx    Rectal cancer Neg Hx     No Known Allergies  Current Outpatient Medications on File Prior to Visit  Medication Sig Dispense Refill   ciclopirox  (LOPROX ) 0.77 % cream Apply topically 2 (two) times daily. 30 g 0   NIFEdipine  (PROCARDIA -XL/NIFEDICAL-XL) 30 MG 24 hr tablet Take 1 tablet (30 mg total) by mouth daily. 30 tablet 11   No current facility-administered medications on file prior to visit.    BP 130/82   Pulse 91   Temp (!) 97.2 F (36.2 C) (Temporal)   Ht 5' 7 (1.702 m)   Wt 188 lb (85.3 kg)   SpO2 96%   BMI 29.44 kg/m  Objective:   Physical Exam  Cardiovascular:     Rate and Rhythm: Normal rate and regular rhythm.     Comments: Bilateral lateral malleolus region noted to be mildly swollen. Pulmonary:     Effort: Pulmonary effort is normal.     Breath sounds: Normal breath sounds.   Musculoskeletal:     Cervical back: Neck supple.     Right lower leg: No edema.     Left lower leg: No edema.   Skin:    General: Skin is warm and dry.   Neurological:     Mental Status: She is  alert and oriented to person, place, and time.   Psychiatric:        Mood and Affect: Mood normal.           Assessment & Plan:  Pedal edema Assessment & Plan:  Differentials include venous insufficiency versus calcium channel blocker side effects. Discussed both differentials with patient today.  She will start elevating her legs at her desk while working.  We also discussed compression socks. If these interventions do not help then consider holding nifedipine  temporarily.  Consider venous blood flow studies.  BNP and BMP pending today.  Orders: -     Basic metabolic panel with GFR -     Brain natriuretic peptide  Chronic right-sided thoracic back pain Assessment & Plan: Unclear etiology.  Question muscular cause? X-ray of thoracic spine ordered and pending.  Orders: -     DG Thoracic Spine W/Swimmers  GAD (generalized anxiety disorder) Assessment & Plan: Stable.  Continue therapy. Agree to renew FMLA paperwork regular 6 months.         Reena Borromeo K Chavis Tessler, NP

## 2023-08-09 NOTE — Assessment & Plan Note (Addendum)
 Differentials include venous insufficiency versus calcium channel blocker side effects. Discussed both differentials with patient today.  She will start elevating her legs at her desk while working.  We also discussed compression socks. If these interventions do not help then consider holding nifedipine  temporarily.  Consider venous blood flow studies.  BNP and BMP pending today.

## 2023-08-09 NOTE — Assessment & Plan Note (Signed)
 Unclear etiology.  Question muscular cause? X-ray of thoracic spine ordered and pending.

## 2023-08-09 NOTE — Assessment & Plan Note (Signed)
 Stable.  Continue therapy. Agree to renew FMLA paperwork regular 6 months.

## 2023-08-09 NOTE — Patient Instructions (Signed)
 Complete xray(s) and lab prior to leaving today. I will notify you of your results once received.  Elevate your legs anytime you are sitting.  Consider using compression socks.  Please update if no improvement.  It was a pleasure to see you today!

## 2023-08-10 ENCOUNTER — Ambulatory Visit: Payer: Self-pay | Admitting: Primary Care

## 2023-08-10 DIAGNOSIS — R600 Localized edema: Secondary | ICD-10-CM

## 2023-08-10 DIAGNOSIS — N289 Disorder of kidney and ureter, unspecified: Secondary | ICD-10-CM

## 2023-08-11 ENCOUNTER — Other Ambulatory Visit

## 2023-08-17 ENCOUNTER — Ambulatory Visit: Admission: RE | Admit: 2023-08-17 | Source: Ambulatory Visit

## 2023-08-22 ENCOUNTER — Other Ambulatory Visit

## 2023-08-24 ENCOUNTER — Ambulatory Visit
Admission: RE | Admit: 2023-08-24 | Discharge: 2023-08-24 | Disposition: A | Discharge status: Home / Self Care | Source: Ambulatory Visit | Attending: Primary Care | Admitting: Primary Care

## 2023-08-24 DIAGNOSIS — N289 Disorder of kidney and ureter, unspecified: Secondary | ICD-10-CM | POA: Diagnosis present

## 2023-08-24 DIAGNOSIS — R600 Localized edema: Secondary | ICD-10-CM | POA: Diagnosis present

## 2023-08-28 ENCOUNTER — Ambulatory Visit: Payer: Self-pay | Admitting: Primary Care

## 2023-08-30 ENCOUNTER — Telehealth: Payer: Self-pay | Admitting: Primary Care

## 2023-08-30 NOTE — Telephone Encounter (Signed)
 Type of forms received: FMLA   Routed to: [employer] received by : Randall Cirri     Individual made aware of 3-5 business day turn around (Y/N): Y   Form completed and patient made aware of charges(Y/N):Y   Form location:  Clark's folder up front  Pt would need this form to be completed in a week and would like a phone call back at   [phone]

## 2023-08-31 NOTE — Telephone Encounter (Signed)
 Received patients FMLA forms for extension. Spoke with pt over the phone to verifiy begin and end dates, and frequency/duration.  Will fax to [employer] at [phone] when complete.  Placed in providers box for review.

## 2023-08-31 NOTE — Telephone Encounter (Signed)
 Left message to return call to office. Need to follow up regarding FMLA/disability forms.

## 2023-09-02 DIAGNOSIS — Z0279 Encounter for issue of other medical certificate: Secondary | ICD-10-CM

## 2023-09-02 NOTE — Telephone Encounter (Signed)
 Completed and placed on Erin's desk.

## 2023-09-02 NOTE — Telephone Encounter (Signed)
 Completed FMLA paperwork received and faxed to [employer] at [phone].  Copy sent to scan.  Patient notified via Mychart that a copy of the FMLA is ready to pick up at our front desk at her convenience.

## 2023-09-03 IMAGING — MG MM DIGITAL SCREENING BILAT W/ TOMO AND CAD
8 series · 8 of 24 positions shown · non-contrast
Comparison: Previous exam(s).

CLINICAL DATA: Screening.

EXAM:
DIGITAL SCREENING BILATERAL MAMMOGRAM WITH TOMOSYNTHESIS AND CAD
TECHNIQUE: Bilateral screening digital craniocaudal and mediolateral oblique
mammograms were obtained. Bilateral screening digital breast
tomosynthesis was performed. The images were evaluated with
computer-aided detection.

[R MLO synth-2D]
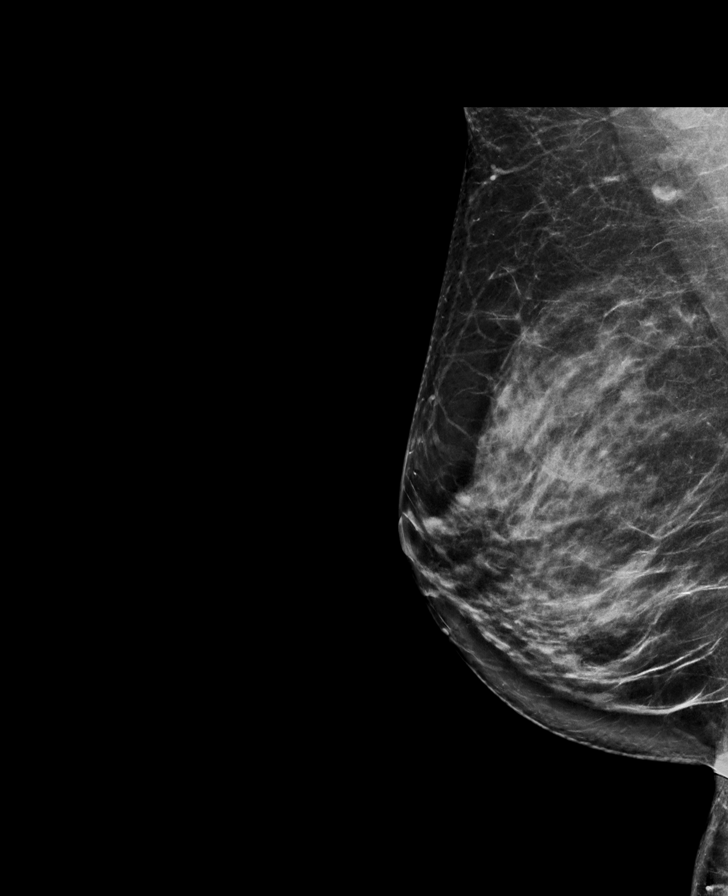

[R CC synth-2D]
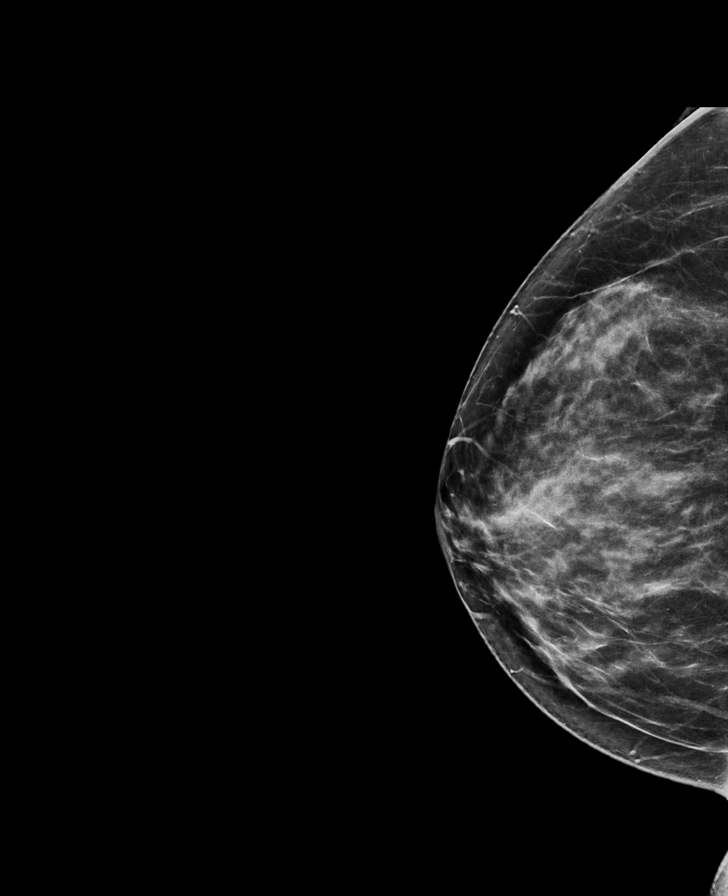

[L MLO synth-2D]
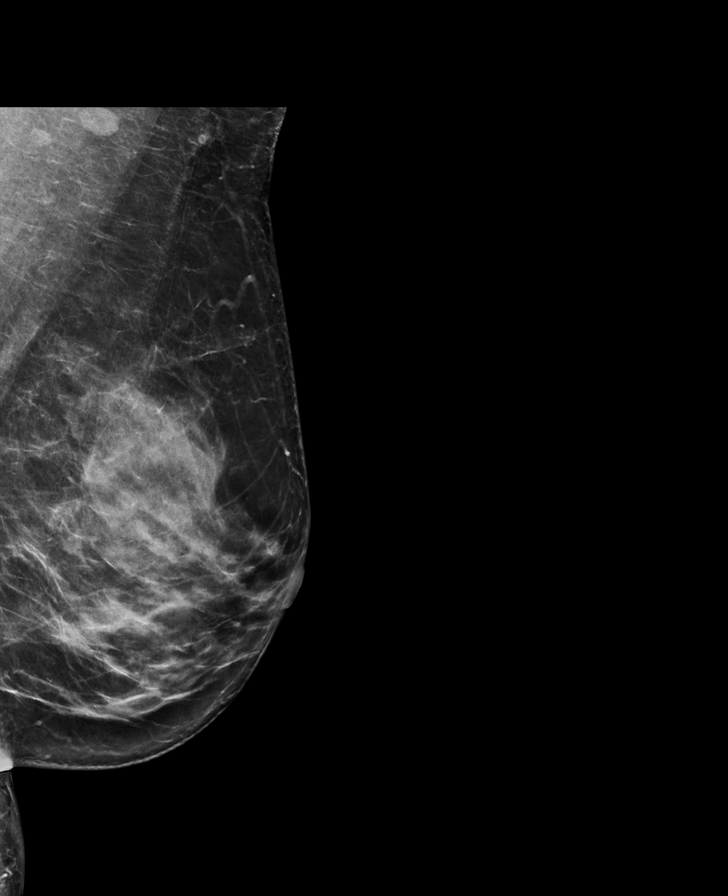

[L CC synth-2D]
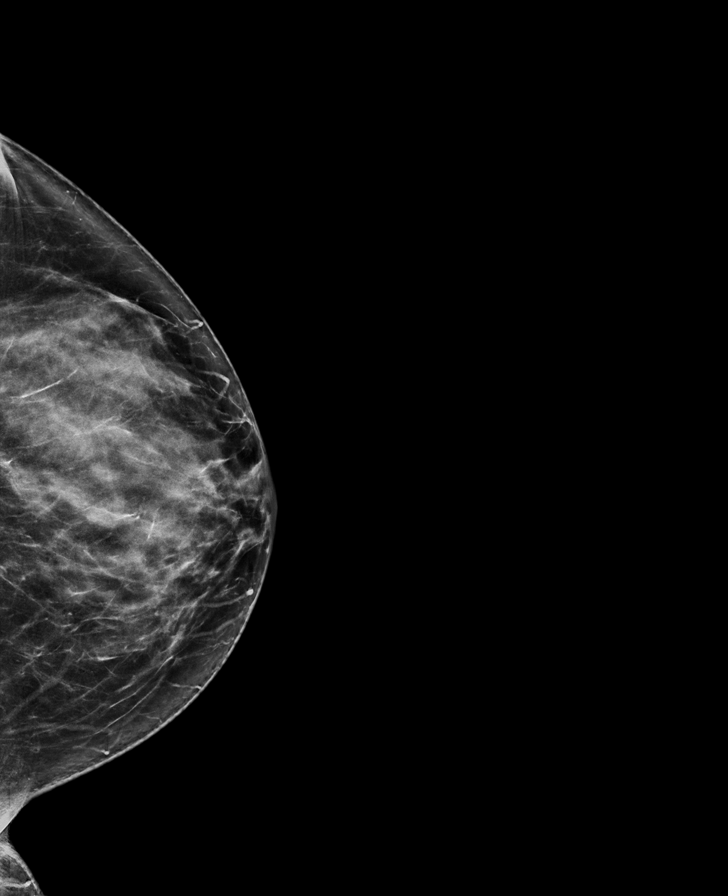

[R MLO tomo · tomo slice 41/81.0]
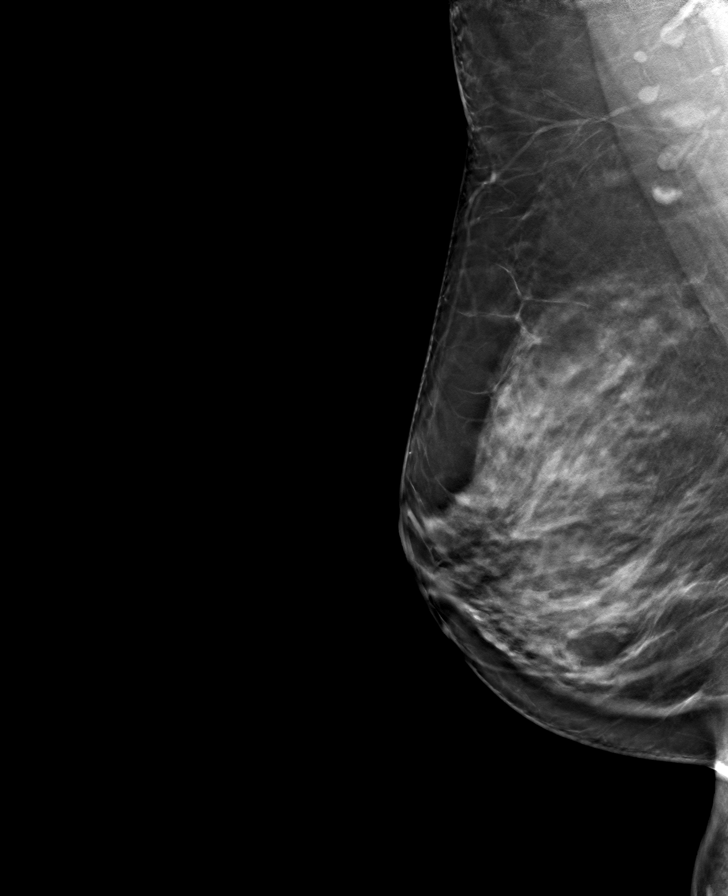

[R CC tomo · tomo slice 39/76.0]
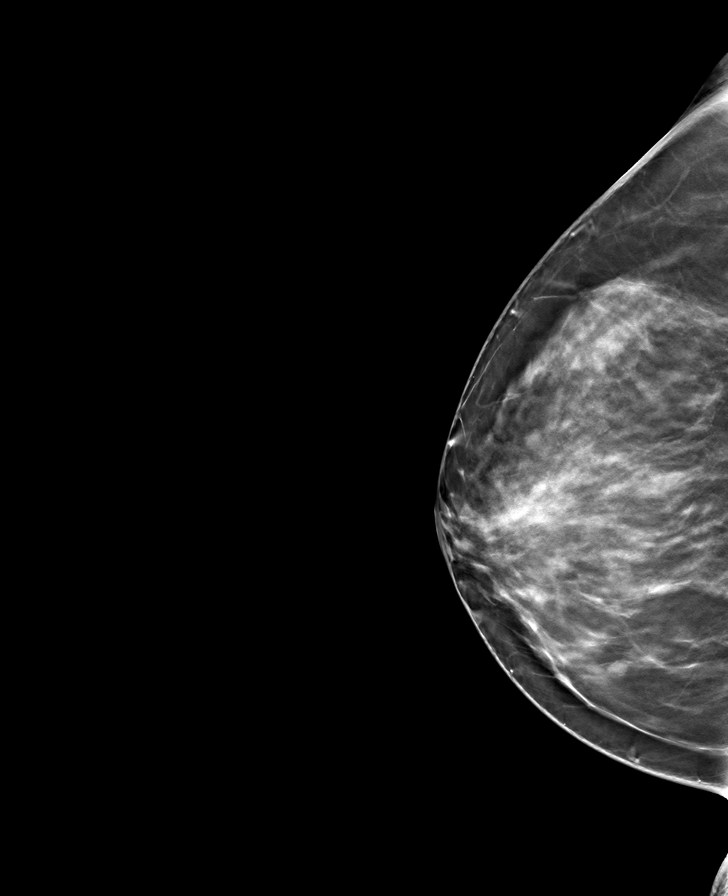

[L CC tomo · tomo slice 37/74.0]
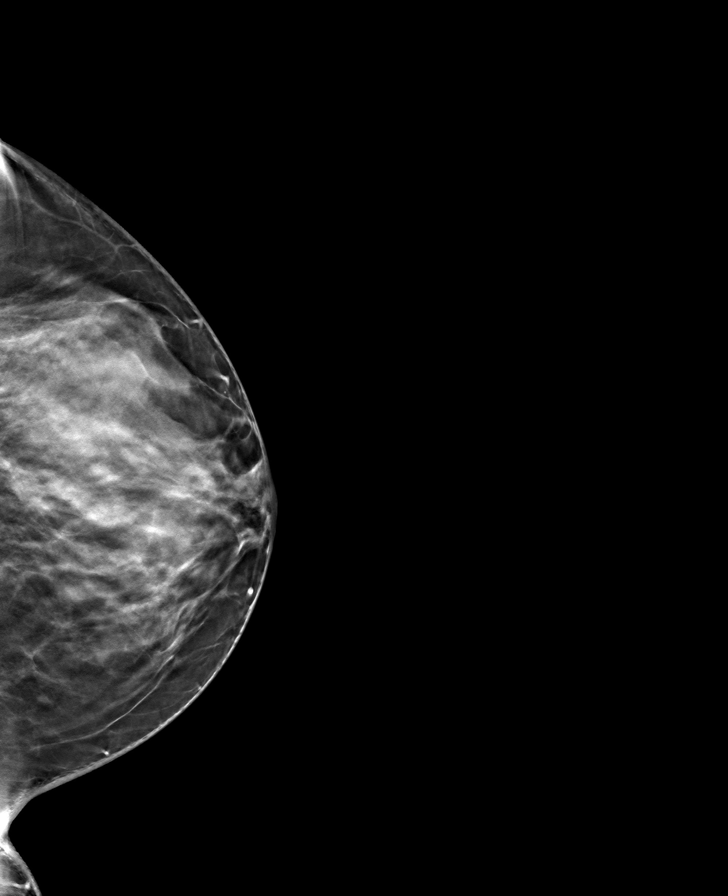

[L MLO tomo · tomo slice 41/81.0]
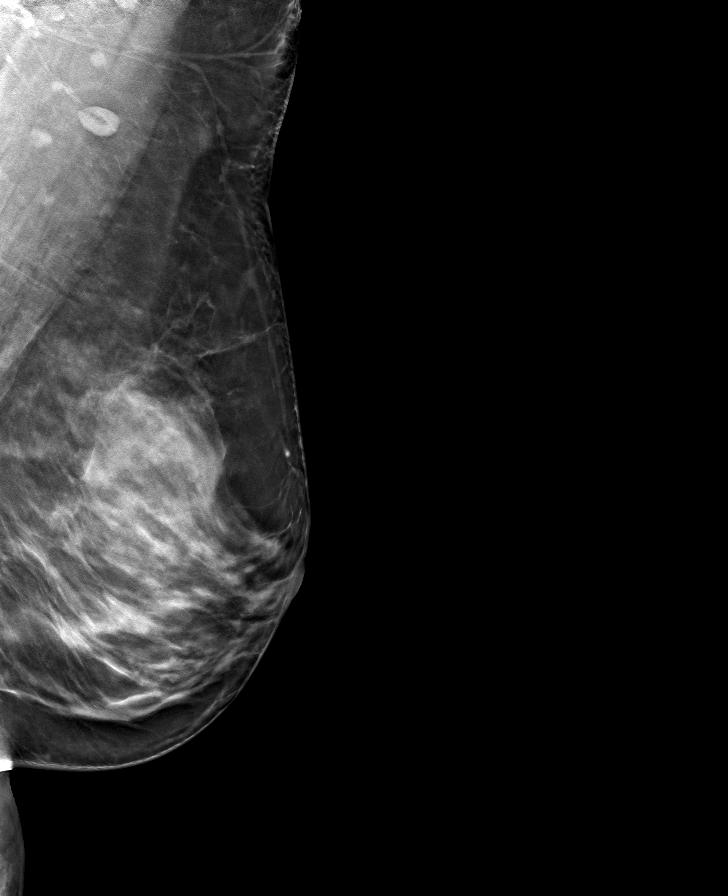

[8 of 24 positions shown; findings below may reference images not displayed]

ACR Breast Density Category c: The breast tissue is heterogeneously
dense, which may obscure small masses.
FINDINGS: There are no findings suspicious for malignancy.
IMPRESSION: No mammographic evidence of malignancy. A result letter of this
screening mammogram will be mailed directly to the patient.

RECOMMENDATION:
Screening mammogram in one year. (Code:Q3-W-BC3)

BI-RADS CATEGORY  1: Negative.

## 2023-09-07 ENCOUNTER — Encounter (HOSPITAL_BASED_OUTPATIENT_CLINIC_OR_DEPARTMENT_OTHER): Payer: Self-pay

## 2023-09-08 ENCOUNTER — Encounter (HOSPITAL_BASED_OUTPATIENT_CLINIC_OR_DEPARTMENT_OTHER): Admitting: Family

## 2023-09-20 NOTE — Telephone Encounter (Signed)
 Placed completed forms on Erin's desk.

## 2023-09-20 NOTE — Telephone Encounter (Signed)
 Rocky, can you assist?

## 2023-09-20 NOTE — Telephone Encounter (Signed)
 Updated forms faxed to [phone]. Copy sent to scan.

## 2023-09-20 NOTE — Telephone Encounter (Signed)
 Yes, I have placed forms on your desk to update for the patient.

## 2023-09-23 ENCOUNTER — Encounter (HOSPITAL_BASED_OUTPATIENT_CLINIC_OR_DEPARTMENT_OTHER): Admitting: Family

## 2023-09-23 NOTE — Telephone Encounter (Signed)
Upon patient request DME selection is [facility] Patient understands he will be contacted by [facility] to set up his cpap. Patient understands to call if [facility] does not contact him with new setup in a timely manner. Patient understands they will be called once confirmation has been received from [facility] that they have received their new machine to schedule 10 week follow up appointment. Brodnax notified of new cpap order  Please add to [facility] Patient was grateful for the call and thanked me.

## 2023-11-23 ENCOUNTER — Telehealth: Admitting: Primary Care

## 2023-11-23 ENCOUNTER — Ambulatory Visit: Admitting: Primary Care

## 2023-11-23 ENCOUNTER — Telehealth: Payer: Self-pay | Admitting: Primary Care

## 2023-11-23 ENCOUNTER — Encounter: Payer: Self-pay | Admitting: Primary Care

## 2023-11-23 DIAGNOSIS — B353 Tinea pedis: Secondary | ICD-10-CM

## 2023-11-23 DIAGNOSIS — F411 Generalized anxiety disorder: Secondary | ICD-10-CM | POA: Diagnosis not present

## 2023-11-23 MED ORDER — ESCITALOPRAM OXALATE 10 MG PO TABS: 10.0000 mg | ORAL_TABLET | Freq: Every day | ORAL | 0 refills | Status: DC

## 2023-11-23 MED ORDER — CICLOPIROX OLAMINE 0.77 % EX CREA: TOPICAL_CREAM | Freq: Two times a day (BID) | CUTANEOUS | 0 refills | Status: AC

## 2023-11-23 NOTE — Patient Instructions (Signed)
 Start Lexapro 10 mg tablets for anxiety/depression.  Take 1/2 tablet by mouth daily for 1 week then increase to 1 full tablet daily thereafter.  Please have your HR department send FMLA paperwork.  Please update me regarding your symptoms.  I will see you in December as scheduled.

## 2023-11-23 NOTE — Telephone Encounter (Signed)
 Patient dropped off document FMLA, to be filled out by provider. Patient requested to send it back via Call Patient to pick up within 5-days. Document is located in providers tray at front office.Please advise at Mobile [phone] (mobile)

## 2023-11-23 NOTE — Telephone Encounter (Signed)
 Received Healthcare providers statement /mental health form as well as new FMLA forms to be completed asking for: VISIT NOTES, TEST RESULTS/EXAM FINDINGS, AND ANY CURRENT SPECIFIC RESTRICTIONS AND LIMITATIONS.   Will fax completed forms to [employer] service center at [phone] when received.   Will notify patient via mobile phone at [phone] when complete as requested by patient.  Forms placed in providers box for review.

## 2023-11-23 NOTE — Assessment & Plan Note (Signed)
 Uncontrolled due to personal stressors.  Agree to provide continuous FMLA with start date of Monday, October 6 and return to work date on Wednesday, November 19. She will have her HR department send paperwork.  Start Lexapro 10 mg daily. Continue with therapy.  Close follow-up in December as scheduled.

## 2023-11-23 NOTE — Progress Notes (Signed)
 Patient ID: Maria [name], female    DOB: Apr 10, 1975, 48 y.o.   MRN: [id number]  Virtual visit completed through caregility, a video enabled telemedicine application. Due to national recommendations of social distancing due to COVID-19, a virtual visit is felt to be most appropriate for this patient at this time. Reviewed limitations, risks, security and privacy concerns of performing a virtual visit and the availability of in person appointments. I also reviewed that there may be a patient responsible charge related to this service. The patient agreed to proceed.   Patient location: home Provider location: [facility], office Persons participating in this virtual visit: patient, provider   If any vitals were documented, they were collected by patient at home unless specified below.    There were no vitals taken for this visit.   CC: Insomnia and Headaches Subjective:   HPI: Maria [name] is a 48 y.o. female with a history of hypertension, hot flashes, anemia, GAD, palpitations, frequent headaches, prediabetes presenting on 11/23/2023 for Insomnia and Headache  She is also needing a refill of her ciclopirox .  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 Her father will be moving in with her next week through the end of December 2025.   She is seeking continuous medical leave due to persistent insomnia. She has difficulty falling asleep and staying asleep. She recently began using a CPAP machine at night. She's also developed increased headaches from her anxiety and stress. Her husband mentioned that he's noticed that she appears depressed. She has little motivation to do things, doesn't want to go anywhere, feels constantly anxious at work, is overwhelmed with her father's illness.   She is currently seeing a therapist. She would like 6 weeks of continuous leave for 6 weeks with start date on  Monday October 6th and return to work date on November 19th.      08/30/2022    9:10 AM 12/31/2020    8:09 AM  GAD 7 : Generalized Anxiety Score  Nervous, Anxious, on Edge 2 3  Control/stop worrying 2 3  Worry too much - different things 2 3  Trouble relaxing 3 0  Restless 3 0  Easily annoyed or irritable 2 2  Afraid - awful might happen 1 3  Total GAD 7 Score 15 14  Anxiety Difficulty Somewhat difficult Somewhat difficult    Flowsheet Row Office Visit from 01/21/2023 in [facility] [place]  PHQ-9 Total Score 6         Relevant past medical, surgical, family and social history reviewed and updated as indicated. Interim medical history since our last visit reviewed. Allergies and medications reviewed and updated. Outpatient Medications Prior to Visit  Medication Sig Dispense Refill   NIFEdipine  (PROCARDIA -XL/NIFEDICAL-XL) 30 MG 24 hr tablet Take 1 tablet (30 mg total) by mouth daily. 30 tablet 11   ciclopirox  (LOPROX ) 0.77 % cream Apply topically 2 (two) times daily. (Patient not taking: Reported on 11/23/2023) 30 g 0   No facility-administered medications prior to visit.     Per HPI unless specifically indicated in ROS section below Review of Systems Objective:  There were no vitals taken for this visit.  Wt Readings from Last 3 Encounters:  08/09/23 188 lb (85.3 kg)  05/19/23 188 lb 14.4 oz (85.7 kg)  02/09/23 195 lb (88.5 kg)       Physical exam: General: Alert and  oriented x 3, no distress, does not appear sickly  Pulmonary: Speaks in complete sentences without increased work of breathing, no cough during visit.  Psychiatric: Normal mood, thought content, and behavior.     Results for orders placed or performed in visit on 08/09/23  Basic metabolic panel with GFR   Collection Time: 08/09/23  2:54 PM  Result Value Ref Range   Sodium 139 135 - 145 mEq/L   Potassium 4.1 3.5 - 5.1 mEq/L   Chloride 102 96 - 112 mEq/L   CO2 33 (H) 19 -  32 mEq/L   Glucose, Bld 88 70 - 99 mg/dL   BUN 15 6 - 23 mg/dL   Creatinine, Ser 8.84 0.40 - 1.20 mg/dL   GFR 44.09 (L) >39.99 mL/min   Calcium 9.4 8.4 - 10.5 mg/dL  Brain natriuretic peptide   Collection Time: 08/09/23  2:54 PM  Result Value Ref Range   Pro B Natriuretic peptide (BNP) 12.0 0.0 - 100.0 pg/mL   Assessment & Plan:   Problem List Items Addressed This Visit       Musculoskeletal and Integument   Tinea pedis of right foot   Relevant Medications   ciclopirox  (LOPROX ) 0.77 % cream     Other   GAD (generalized anxiety disorder) - Primary   Uncontrolled due to personal stressors.  Agree to provide continuous FMLA with start date of Monday, October 6 and return to work date on Wednesday, November 19. She will have her HR department send paperwork.  Start Lexapro 10 mg daily. Continue with therapy.  Close follow-up in December as scheduled.      Relevant Medications   escitalopram (LEXAPRO) 10 MG tablet     Meds ordered this encounter  Medications   escitalopram (LEXAPRO) 10 MG tablet    Sig: Take 1 tablet (10 mg total) by mouth daily. for anxiety and depression.    Dispense:  90 tablet    Refill:  0    Supervising Provider:   BEDSOLE, AMY E [[id number]]   ciclopirox  (LOPROX ) 0.77 % cream    Sig: Apply topically 2 (two) times daily.    Dispense:  30 g    Refill:  0    Supervising Provider:   BEDSOLE, AMY E [[id number]]   No orders of the defined types were placed in this encounter.   I discussed the assessment and treatment plan with the patient. The patient was provided an opportunity to ask questions and all were answered. The patient agreed with the plan and demonstrated an understanding of the instructions. The patient was advised to call back or seek an in-person evaluation if the symptoms worsen or if the condition fails to improve as anticipated.  Follow up plan:  Start Lexapro 10 mg tablets for anxiety/depression.  Take 1/2 tablet by mouth daily for 1 week  then increase to 1 full tablet daily thereafter.  Please have your HR department send FMLA paperwork.  Please update me regarding your symptoms.  I will see you in December as scheduled.  Rosita Guzzetta K Afrika Brick, NP

## 2023-11-24 NOTE — Telephone Encounter (Signed)
 Reviewed paperwork. The paperwork that she dropped off is for a continuous medical leave for caregiver stress. Recent telemedicine visit on 11/23/23. Can we fix the forms? Thanks!

## 2023-11-24 NOTE — Telephone Encounter (Signed)
 Completed and placed on Erin's desk.

## 2023-11-24 NOTE — Telephone Encounter (Signed)
 Pt is requesting continuous leave for caregiver stress.  Updated forms placed in providers box for review.

## 2023-11-24 NOTE — Telephone Encounter (Signed)
 Completed forms received and faxed to [phone]  Copy sent to scan  Copy left for patient at the front desk, pt notified via voicemail

## 2023-12-01 ENCOUNTER — Ambulatory Visit: Admitting: Primary Care

## 2023-12-05 ENCOUNTER — Encounter (HOSPITAL_BASED_OUTPATIENT_CLINIC_OR_DEPARTMENT_OTHER): Payer: Self-pay

## 2023-12-08 ENCOUNTER — Encounter (HOSPITAL_BASED_OUTPATIENT_CLINIC_OR_DEPARTMENT_OTHER): Admitting: Family

## 2023-12-13 NOTE — Telephone Encounter (Signed)
 Rocky, can you change her return to work date and place in my inbox for my signature?

## 2023-12-14 NOTE — Telephone Encounter (Signed)
 Noted

## 2023-12-20 NOTE — Telephone Encounter (Signed)
 Updated forms received and re-faxed to [phone]  Copy sent to scan  Pt notified via MyChart

## 2023-12-20 NOTE — Telephone Encounter (Signed)
 Completed and placed on Erin's desk.

## 2023-12-22 NOTE — Telephone Encounter (Signed)
 [employer] requesting clarification on return to work dates.  Asking for provider to complete provider questionnaire.  Forms placed in providers box for review.

## 2023-12-27 NOTE — Telephone Encounter (Signed)
 Completed forms and placed on Maria [name]'s desk.

## 2023-12-27 NOTE — Telephone Encounter (Signed)
 Updated form received from provider and faxed to [employer] at [phone]  Updated copy sent to scan  Pt notified via MyChart forms were faxed and a copy is at the front desk to pick up for her records.

## 2024-01-03 ENCOUNTER — Encounter: Payer: Self-pay | Admitting: Primary Care

## 2024-01-03 ENCOUNTER — Telehealth (INDEPENDENT_AMBULATORY_CARE_PROVIDER_SITE_OTHER): Admitting: Primary Care

## 2024-01-03 ENCOUNTER — Telehealth: Payer: Self-pay | Admitting: Primary Care

## 2024-01-03 VITALS — BP 159/106 | HR 87 | Ht 67.0 in | Wt 187.0 lb

## 2024-01-03 DIAGNOSIS — F411 Generalized anxiety disorder: Secondary | ICD-10-CM | POA: Diagnosis not present

## 2024-01-03 MED ORDER — ESCITALOPRAM OXALATE 20 MG PO TABS: 20.0000 mg | ORAL_TABLET | Freq: Every day | ORAL | 0 refills | Status: AC

## 2024-01-03 NOTE — Telephone Encounter (Signed)
 Patient dropped off document return to work authorization form , to be filled out by provider. Patient requested to send it back via Call Patient to pick up within 5-days. Document is located in providers tray at front office.Please advise at Mobile [phone] (mobile)    Pt stated that she needs form signed in order to return to work tomorrow

## 2024-01-03 NOTE — Patient Instructions (Addendum)
 We increased your dose of Lexapro to 20 mg daily for anxiety.  I sent a new prescription to your pharmacy.  Please read attached the blank form for completion.  Please also monitor your blood pressure as discussed  It was a pleasure to see you today!

## 2024-01-03 NOTE — Progress Notes (Signed)
 Patient ID: Maria [name], female    DOB: [birth year], 48 y.o.   MRN: [id number]  Virtual visit completed through [employer], a video enabled telemedicine application. Due to national recommendations of social distancing due to COVID-19, a virtual visit is felt to be most appropriate for this patient at this time. Reviewed limitations, risks, security and privacy concerns of performing a virtual visit and the availability of in person appointments. I also reviewed that there may be a patient responsible charge related to this service. The patient agreed to proceed.   Patient location: home Provider location: [place] at [facility], office Persons participating in this virtual visit: patient, provider   If any vitals were documented, they were collected by patient at home unless specified below.    BP (!) 159/106   Pulse 87   Ht 5' 7 (1.702 m)   Wt 187 lb (84.8 kg)   BMI 29.29 kg/m    CC: FMLA  Subjective:   HPI: Maria [name] is a 48 y.o. female with a history of hypertension, GAD, palpitations, frequent headaches, prediabetes presenting on 01/03/2024 for Medical Management of Chronic Issues (Migraine f/u.)  Currently on continuous FMLA since 11/21/2023 with tentative return to work date on 01/04/2024. She is on FMLA for anxiety due to personal stressors. She has since requested an extension to return 01/16/2024 due to anxiety, sleep disturbance, and headaches.   Today she discusses that she cannot return to work due to her increased anxiety as the holidays are nearly here. She's worried that this may be the last holiday with her father. Symptoms include feeling more agitated, overwhelmed, cannot relax, nervous, increased stress as she is the caregiver for her father, not sleeping well. Because of these symptoms she gets too emotional caring for her father like I'm grieving while he's alive. These emotions occur sporadically for which she cannot control.   She is managed  on Lexapro 10 mg for anxiety which helps some. Her sister is coming soon to help her take over the care of her father. She's not ready to return to work tomorrow as scheduled.        Relevant past medical, surgical, family and social history reviewed and updated as indicated. Interim medical history since our last visit reviewed. Allergies and medications reviewed and updated. Outpatient Medications Prior to Visit  Medication Sig Dispense Refill   ciclopirox  (LOPROX ) 0.77 % cream Apply topically 2 (two) times daily. 30 g 0   NIFEdipine  (PROCARDIA -XL/NIFEDICAL-XL) 30 MG 24 hr tablet Take 1 tablet (30 mg total) by mouth daily. 30 tablet 11   escitalopram (LEXAPRO) 10 MG tablet Take 1 tablet (10 mg total) by mouth daily. for anxiety and depression. 90 tablet 0   No facility-administered medications prior to visit.     Per HPI unless specifically indicated in ROS section below Review of Systems  Respiratory:  Negative for shortness of breath.   Cardiovascular:  Negative for chest pain.  Neurological:  Positive for headaches.  Hematological:        Sleep disturbance   Psychiatric/Behavioral:  Positive for sleep disturbance. The patient is nervous/anxious.    Objective:  BP (!) 159/106   Pulse 87   Ht 5' 7 (1.702 m)   Wt 187 lb (84.8 kg)   BMI 29.29 kg/m   Wt Readings from Last 3 Encounters:  01/03/24 187 lb (84.8 kg)  08/09/23 188 lb (85.3 kg)  05/19/23 188 lb 14.4 oz (85.7 kg)  BP Readings from Last 3 Encounters:  01/03/24 (!) 159/106  08/09/23 130/82  05/19/23 128/78     Physical exam: General: Alert and oriented x 3, no distress, does not appear sickly  Pulmonary: Speaks in complete sentences without increased work of breathing, no cough during visit.  Psychiatric: Fidgety, appears anxious and overwhelmed, tearful,     Results for orders placed or performed in visit on 08/09/23  Basic metabolic panel with GFR   Collection Time: 08/09/23  2:54 PM  Result  Value Ref Range   Sodium 139 135 - 145 mEq/L   Potassium 4.1 3.5 - 5.1 mEq/L   Chloride 102 96 - 112 mEq/L   CO2 33 (H) 19 - 32 mEq/L   Glucose, Bld 88 70 - 99 mg/dL   BUN 15 6 - 23 mg/dL   Creatinine, Ser 8.84 0.40 - 1.20 mg/dL   GFR 44.09 (L) >39.99 mL/min   Calcium 9.4 8.4 - 10.5 mg/dL  Brain natriuretic peptide   Collection Time: 08/09/23  2:54 PM  Result Value Ref Range   Pro B Natriuretic peptide (BNP) 12.0 0.0 - 100.0 pg/mL   Assessment & Plan:   Problem List Items Addressed This Visit       Other   GAD (generalized anxiety disorder) - Primary   Relevant Medications   escitalopram (LEXAPRO) 20 MG tablet     Meds ordered this encounter  Medications   escitalopram (LEXAPRO) 20 MG tablet    Sig: Take 1 tablet (20 mg total) by mouth daily. For anxiety    Dispense:  90 tablet    Refill:  0    Supervising Provider:   BEDSOLE, AMY E [[id number]]   No orders of the defined types were placed in this encounter.   I discussed the assessment and treatment plan with the patient. The patient was provided an opportunity to ask questions and all were answered. The patient agreed with the plan and demonstrated an understanding of the instructions. The patient was advised to call back or seek an in-person evaluation if the symptoms worsen or if the condition fails to improve as anticipated.  Follow up plan:  We increased your dose of Lexapro to 20 mg daily for anxiety.  I sent a new prescription to your pharmacy.  Please read attached the blank form for completion.  It was a pleasure to see you today!   Maria [name] K Cedrica Brune, NP

## 2024-01-03 NOTE — Telephone Encounter (Signed)
  Return to work form placed in providers box to review.

## 2024-01-04 NOTE — Telephone Encounter (Signed)
 Spoke with patient, ov notes faxed to [employer] at [phone] as requested.  Pt asks that return to work paperwork be faxed next week before return date of 12.1

## 2024-01-04 NOTE — Telephone Encounter (Signed)
 Completed and placed on Erin's desk.

## 2024-01-04 NOTE — Telephone Encounter (Signed)
 Completed forms received from provider.  Will fax completed forms next week as patient requested.

## 2024-01-04 NOTE — Telephone Encounter (Signed)
 Noted

## 2024-01-04 NOTE — Assessment & Plan Note (Signed)
 Improving but not quite controlled.  Increase Lexapro to 20 mg daily. Agreed to complete paperwork to extend FMLA for return date of 01/16/2024.

## 2024-01-09 NOTE — Telephone Encounter (Signed)
 Return to work form faxed today 11.24.25 to [employer] at [phone] as requested by patient MyChart message.  Pt notified via MyChart form has been faxed

## 2024-01-24 ENCOUNTER — Encounter: Admitting: Primary Care

## 2024-02-23 ENCOUNTER — Ambulatory Visit (INDEPENDENT_AMBULATORY_CARE_PROVIDER_SITE_OTHER): Admitting: Family

## 2024-02-23 ENCOUNTER — Encounter (HOSPITAL_BASED_OUTPATIENT_CLINIC_OR_DEPARTMENT_OTHER): Payer: Self-pay

## 2024-02-23 ENCOUNTER — Encounter (HOSPITAL_BASED_OUTPATIENT_CLINIC_OR_DEPARTMENT_OTHER): Payer: Self-pay | Admitting: Family

## 2024-02-23 VITALS — BP 124/84 | HR 80 | Ht 69.0 in | Wt 187.9 lb

## 2024-02-23 DIAGNOSIS — G4733 Obstructive sleep apnea (adult) (pediatric): Secondary | ICD-10-CM

## 2024-02-23 DIAGNOSIS — I10 Essential (primary) hypertension: Secondary | ICD-10-CM

## 2024-02-23 MED ORDER — NIFEDIPINE ER OSMOTIC RELEASE 30 MG PO TB24: 30.0000 mg | ORAL_TABLET | Freq: Every day | ORAL | 3 refills | Status: AC

## 2024-02-23 NOTE — Patient Instructions (Addendum)
 Medication Instructions:   Your physician recommends that you continue on your current medications as directed. Please refer to the Current Medication list given to you today.    Labwork:  none   Testing/Procedures:  none   Follow-Up: Please follow up in _1 year  in [facility] with Dr. Raford, Reche Finder, NP or Allean Mink PharmD    Special Instructions:   Reche Finder, NP will send you a mychart in a couple weeks to check in on your home blood pressures

## 2024-02-23 NOTE — Progress Notes (Signed)
 "  [facility] Assessment:    Date:  02/23/2024   ID:  Maria [name], DOB [birth year], MRN [id number]  PCP:  Maria Comer POUR, NP  Cardiologist:  Shelda Bruckner, MD  Nephrologist:  Referring MD: Maria Comer POUR, NP   CC: Hypertension  History of Present Illness:    Maria [name] is a 49 y.o. female with a hx of hypertension, GAD, vitamin D  deficiency, prediabetes here to follow up  in the [facility].   Seen by primary care provider 08/30/22 noted inconsistent use of hydrochlorothiazide , recommended to resume regular use.   Established with [facility] 05/19/23. BP at home in 140s though noted frequent urination. Hydrochlorothiazide  changed to nifedipine . Sleep study revealed OSA and CPAP initiated.   Presents today for follow up independently. Reports feeling better on Nifedipine  than on the hydrochlorothiazide . BP at home 130/80s with upper arm cuff, not previously checked for accuracy. No chest pain with exertion or exercise. Occasional chest uneasiness at night. Which she reports is not painful. She attributes to menopause or anxiety. Encouraged to discuss with PCP if bothersome at upcoming visit. Wearing CPAP regularly.   Previous antihypertensives: Amlodipine - felt off Hydrochlorothiazide  - urinary frequency    Past Medical History:  Diagnosis Date   Abnormal TSH    Cataract    Chest tightness    Dizziness    Epigastric pain 12/09/2021   History of UTI    Hypertension    IDA (iron deficiency anemia)    Palpitations    Precordial pain     Past Surgical History:  Procedure Laterality Date   ABDOMINOPLASTY  2019   CATARACT EXTRACTION Left    2021   COLONOSCOPY     COSMETIC SURGERY  12/10/202   Tummy Tuck   EYE SURGERY     UPPER GASTROINTESTINAL ENDOSCOPY      Current Medications: Current Meds  Medication Sig   ciclopirox  (LOPROX ) 0.77 % cream Apply topically 2 (two) times daily.    Probiotic Product (PROBIOTIC PO) Take 1 capsule by mouth daily. Align   Vitamin D -Vitamin K (VITAMIN K2-VITAMIN D3 PO) Take 1 capsule by mouth daily.   [DISCONTINUED] NIFEdipine  (PROCARDIA -XL/NIFEDICAL-XL) 30 MG 24 hr tablet Take 1 tablet (30 mg total) by mouth daily.     Allergies:   Patient has no known allergies.   Social History   Socioeconomic History   Marital status: Married    Spouse name: Not on file   Number of children: Not on file   Years of education: Not on file   Highest education level: Not on file  Occupational History   Not on file  Tobacco Use   Smoking status: Never    Passive exposure: Past   Smokeless tobacco: Never  Vaping Use   Vaping status: Never Used  Substance and Sexual Activity   Alcohol use: No   Drug use: No   Sexual activity: Yes    Birth control/protection: None  Other Topics Concern   Not on file  Social History Narrative   Married.   2 children.   Works as an scientist, water quality.   Enjoys relaxing.    Social Drivers of Health   Tobacco Use: Low Risk (02/23/2024)   Patient History    Smoking Tobacco Use: Never    Smokeless Tobacco Use: Never    Passive Exposure: Past  Financial Resource Strain: Not on file  Food Insecurity: No Food Insecurity (05/19/2023)   Hunger Vital Sign  Worried About Programme Researcher, Broadcasting/film/video in the Last Year: Never true    Ran Out of Food in the Last Year: Never true  Transportation Needs: No Transportation Needs (05/19/2023)   PRAPARE - Administrator, Civil Service (Medical): No    Lack of Transportation (Non-Medical): No  Physical Activity: Insufficiently Active (05/19/2023)   Exercise Vital Sign    Days of Exercise per Week: 4 days    Minutes of Exercise per Session: 20 min  Stress: Not on file  Social Connections: Unknown (06/30/2021)   Received from [facility]   Social Network    Social Network: Not on file  Depression (PHQ2-9): High Risk (01/03/2024)   Depression (PHQ2-9)    PHQ-2 Score: 12   Alcohol Screen: Not on file  Housing: Unknown (05/19/2023)   Housing Stability Vital Sign    Unable to Pay for Housing in the Last Year: No    Number of Times Moved in the Last Year: Not on file    Homeless in the Last Year: No  Utilities: Not At Risk (05/19/2023)   AHC Utilities    Threatened with loss of utilities: No  Health Literacy: Not on file     Family History: The patient's family history includes Arthritis in her maternal grandmother; Birth defects in her maternal grandfather; Diabetes in her maternal grandmother; Hypertension in her maternal grandmother; Throat cancer in her maternal grandfather. There is no history of Breast cancer, Colon cancer, Stomach cancer, Esophageal cancer, or Rectal cancer.  ROS:   Please see the history of present illness.     All other systems reviewed and are negative.  EKGs/Labs/Other Studies Reviewed:         Recent Labs: 08/09/2023: BUN 15; Creatinine, Ser 1.15; Potassium 4.1; Pro B Natriuretic peptide (BNP) 12.0; Sodium 139   Recent Lipid Panel    Component Value Date/Time   CHOL 185 01/21/2023 0817   CHOL 180 12/13/2016 1156   TRIG 106.0 01/21/2023 0817   HDL 45.60 01/21/2023 0817   HDL 56 12/13/2016 1156   CHOLHDL 4 01/21/2023 0817   VLDL 21.2 01/21/2023 0817   LDLCALC 118 (H) 01/21/2023 0817   LDLCALC 141 (H) 01/15/2022 1452    Physical Exam:   VS:  BP 124/84 (BP Location: Left Arm, Patient Position: Sitting, Cuff Size: Large)   Pulse 80   Ht 5' 9 (1.753 m)   Wt 187 lb 14.4 oz (85.2 kg)   SpO2 99%   BMI 27.75 kg/m  , BMI Body mass index is 27.75 kg/m.  Vitals:   02/23/24 1100  BP: 124/84  Pulse: 80  Height: 5' 9 (1.753 m)  Weight: 187 lb 14.4 oz (85.2 kg)  SpO2: 99%  BMI (Calculated): 27.74      GENERAL:  Well appearing HEENT: Pupils equal round and reactive, fundi not visualized, oral mucosa unremarkable NECK:  No jugular venous distention, waveform within normal limits, carotid upstroke brisk and symmetric,  no bruits, no thyromegaly LYMPHATICS:  No cervical adenopathy LUNGS:  Clear to auscultation bilaterally HEART:  RRR.  PMI not displaced or sustained,S1 and S2 within normal limits, no S3, no S4, no clicks, no rubs, no murmurs ABD:  Flat, positive bowel sounds normal in frequency in pitch, no bruits, no rebound, no guarding, no midline pulsatile mass, no hepatomegaly, no splenomegaly EXT:  2 plus pulses throughout, no edema, no cyanosis no clubbing SKIN:  No rashes no nodules NEURO:  Cranial nerves II through XII grossly intact, motor  grossly intact throughout [facility]:  Cognitively intact, oriented to person place and time   ASSESSMENT/PLAN:    HTN - BP reasonably controlled in clinic. Reports readings 130/80s at home. She will take her home cuff to upcoming PCP visit to ensure accuracy. MyChart message in 2 weeks to check in on BP. If not routinely <130/80, consider increasing nifedipine  to 60mg  daily. .Discussed to monitor BP at home at least 2 hours after medications and sitting for 5-10 minutes.   OSA - CPAP compliance encouraged. Endorses wearing regularly  Screening for Secondary Hypertension:     Relevant Labs/Studies:    Latest Ref Rng & Units 08/09/2023    2:54 PM 05/19/2023    9:22 AM 02/09/2023   12:40 AM  Basic Labs  Sodium 135 - 145 mEq/L 139  142  139   Potassium 3.5 - 5.1 mEq/L 4.1  4.2  3.4   Creatinine 0.40 - 1.20 mg/dL 8.84  8.89  8.83        Latest Ref Rng & Units 12/31/2020    8:44 AM 08/15/2020    9:48 AM  Thyroid    TSH 0.35 - 5.50 uIU/mL 0.65  0.64        Latest Ref Rng & Units 05/19/2023    9:22 AM  Renin/Aldosterone   Aldosterone 0.0 - 30.0 ng/dL 89.8   Aldos/Renin Ratio 0.0 - 30.0 22.5        Latest Ref Rng & Units 05/19/2023    9:22 AM  Metanephrines/Catecholamines   Epinephrine 0 - 62 pg/mL <15   Norepinephrine 0 - 874 pg/mL 948   Dopamine 0 - 48 pg/mL <30   Metanephrines 0.0 - 88.0 pg/mL <25.0   Normetanephrines  0.0 - 218.9 pg/mL 164.8              Disposition:    FU with MD/APP/PharmD in 1 year   Medication Adjustments/Labs and Tests Ordered: Current medicines are reviewed at length with the patient today.  Concerns regarding medicines are outlined above.  No orders of the defined types were placed in this encounter.  Meds ordered this encounter  Medications   NIFEdipine  (PROCARDIA -XL/NIFEDICAL-XL) 30 MG 24 hr tablet    Sig: Take 1 tablet (30 mg total) by mouth daily.    Dispense:  90 tablet    Refill:  3    Supervising Provider:   LONNI SLAIN [[id number]]     Signed, Reche GORMAN Finder, NP  02/23/2024 1:03 PM    [facility] "

## 2024-02-29 ENCOUNTER — Encounter: Admitting: Primary Care

## 2024-03-07 ENCOUNTER — Ambulatory Visit: Payer: Self-pay | Admitting: Primary Care

## 2024-03-07 ENCOUNTER — Encounter: Payer: Self-pay | Admitting: Primary Care

## 2024-03-07 ENCOUNTER — Ambulatory Visit: Admitting: Primary Care

## 2024-03-07 VITALS — BP 130/84 | HR 94 | Temp 98.0°F | Ht 67.0 in | Wt 186.5 lb

## 2024-03-07 DIAGNOSIS — E559 Vitamin D deficiency, unspecified: Secondary | ICD-10-CM | POA: Diagnosis not present

## 2024-03-07 DIAGNOSIS — R7303 Prediabetes: Secondary | ICD-10-CM | POA: Diagnosis not present

## 2024-03-07 DIAGNOSIS — F411 Generalized anxiety disorder: Secondary | ICD-10-CM | POA: Diagnosis not present

## 2024-03-07 DIAGNOSIS — I10 Essential (primary) hypertension: Secondary | ICD-10-CM | POA: Diagnosis not present

## 2024-03-07 DIAGNOSIS — Z1231 Encounter for screening mammogram for malignant neoplasm of breast: Secondary | ICD-10-CM

## 2024-03-07 DIAGNOSIS — Z0000 Encounter for general adult medical examination without abnormal findings: Secondary | ICD-10-CM | POA: Diagnosis not present

## 2024-03-07 LAB — URINALYSIS, ROUTINE W REFLEX MICROSCOPIC
Bilirubin Urine: NEGATIVE
Hgb urine dipstick: NEGATIVE
Ketones, ur: NEGATIVE
Leukocytes,Ua: NEGATIVE
Nitrite: NEGATIVE
RBC / HPF: NONE SEEN
Specific Gravity, Urine: 1.025 (ref 1.000–1.030)
Total Protein, Urine: NEGATIVE
Urine Glucose: NEGATIVE
Urobilinogen, UA: 0.2 (ref 0.0–1.0)
pH: 6 (ref 5.0–8.0)

## 2024-03-07 LAB — LIPID PANEL
Cholesterol: 188 mg/dL (ref 28–200)
HDL: 52.9 mg/dL
LDL Cholesterol: 111 mg/dL — ABNORMAL HIGH (ref 10–99)
NonHDL: 134.87
Total CHOL/HDL Ratio: 4
Triglycerides: 118 mg/dL (ref 10.0–149.0)
VLDL: 23.6 mg/dL (ref 0.0–40.0)

## 2024-03-07 LAB — COMPREHENSIVE METABOLIC PANEL WITH GFR
ALT: 15 U/L (ref 3–35)
AST: 13 U/L (ref 5–37)
Albumin: 4.2 g/dL (ref 3.5–5.2)
Alkaline Phosphatase: 65 U/L (ref 39–117)
BUN: 16 mg/dL (ref 6–23)
CO2: 30 meq/L (ref 19–32)
Calcium: 9.3 mg/dL (ref 8.4–10.5)
Chloride: 105 meq/L (ref 96–112)
Creatinine, Ser: 1.09 mg/dL (ref 0.40–1.20)
GFR: 59.37 mL/min — ABNORMAL LOW
Glucose, Bld: 92 mg/dL (ref 70–99)
Potassium: 4 meq/L (ref 3.5–5.1)
Sodium: 141 meq/L (ref 135–145)
Total Bilirubin: 0.5 mg/dL (ref 0.2–1.2)
Total Protein: 7 g/dL (ref 6.0–8.3)

## 2024-03-07 LAB — VITAMIN D 25 HYDROXY (VIT D DEFICIENCY, FRACTURES): VITD: 25.24 ng/mL — ABNORMAL LOW (ref 30.00–100.00)

## 2024-03-07 LAB — HEMOGLOBIN A1C: Hgb A1c MFr Bld: 5.9 % (ref 4.6–6.5)

## 2024-03-07 NOTE — Progress Notes (Signed)
 "  Subjective:    Patient ID: Maria [name], female    DOB: 06-16-1975, 49 y.o.   MRN: [id number]  Maria [name] is a very pleasant 49 y.o. female who presents today for complete physical and follow up of chronic conditions.  History of intermittent elevated blood pressure readings over the years. Currently managed on nifedipine  XL 30 mg daily per cardiology. Last office visit with cardiology was on 02/23/24. She experienced urinary frequency with hydrochlorothiazide  and felt off with amlodipine  previously.  She's not been checking her BP at home. Three days ago she developed room spinning sensation, lasting one day. She is compliant to her nifedipine  daily.   Immunizations: -Tetanus: Completed > 10 years ago, declines  -Influenza: Declines influenza vaccine.  -Shingles: Never completed Shingrix series  Diet: Fair diet.  Exercise: No regular exercise.  Eye exam: Completes annually  Dental exam: Completes semi-annually    Pap Smear: Completes per GYN. Mammogram: Completed in February 2025  Colonoscopy: Completed in 2023, due 2033  BP Readings from Last 3 Encounters:  03/07/24 130/84  02/23/24 124/84  01/03/24 (!) 159/106       Review of Systems  Constitutional:  Negative for unexpected weight change.  HENT:  Negative for rhinorrhea.   Respiratory:  Negative for cough and shortness of breath.   Cardiovascular:  Negative for chest pain.  Gastrointestinal:  Negative for constipation and diarrhea.  Genitourinary:  Negative for difficulty urinating.  Musculoskeletal:  Negative for arthralgias and myalgias.  Skin:  Negative for rash.  Allergic/Immunologic: Negative for environmental allergies.  Neurological:  Negative for dizziness and headaches.  Psychiatric/Behavioral:  The patient is not nervous/anxious.          Past Medical History:  Diagnosis Date   Abnormal TSH    Cataract 03/2019   Chest tightness    Dizziness    Epigastric pain 12/09/2021    History of UTI    Hypertension    IDA (iron deficiency anemia)    Microscopic hematuria 10/30/2020   Palpitations    Pedal edema 08/09/2023   Precordial pain     Social History   Socioeconomic History   Marital status: Married    Spouse name: Not on file   Number of children: Not on file   Years of education: Not on file   Highest education level: Not on file  Occupational History   Not on file  Tobacco Use   Smoking status: Never    Passive exposure: Past   Smokeless tobacco: Never  Vaping Use   Vaping status: Never Used  Substance and Sexual Activity   Alcohol use: No   Drug use: No   Sexual activity: Yes    Birth control/protection: None  Other Topics Concern   Not on file  Social History Narrative   Married.   2 children.   Works as an scientist, water quality.   Enjoys relaxing.    Social Drivers of Health   Tobacco Use: Low Risk (03/07/2024)   Patient History    Smoking Tobacco Use: Never    Smokeless Tobacco Use: Never    Passive Exposure: Past  Financial Resource Strain: Not on file  Food Insecurity: No Food Insecurity (05/19/2023)   Hunger Vital Sign    Worried About Running Out of Food in the Last Year: Never true    Ran Out of Food in the Last Year: Never true  Transportation Needs: No Transportation Needs (05/19/2023)   PRAPARE - Transportation    Lack of  Transportation (Medical): No    Lack of Transportation (Non-Medical): No  Physical Activity: Insufficiently Active (05/19/2023)   Exercise Vital Sign    Days of Exercise per Week: 4 days    Minutes of Exercise per Session: 20 min  Stress: Not on file  Social Connections: Unknown (06/30/2021)   Received from [facility]   Social Network    Social Network: Not on file  Intimate Partner Violence: Unknown (05/22/2021)   Received from [facility]   HITS    Physically Hurt: Not on file    Insult or Talk Down To: Not on file    Threaten Physical Harm: Not on file    Scream or Curse: Not on file  Depression  (PHQ2-9): Low Risk (03/07/2024)   Depression (PHQ2-9)    PHQ-2 Score: 4  Recent Concern: Depression (PHQ2-9) - High Risk (01/03/2024)   Depression (PHQ2-9)    PHQ-2 Score: 12  Alcohol Screen: Not on file  Housing: Unknown (05/19/2023)   Housing Stability Vital Sign    Unable to Pay for Housing in the Last Year: No    Number of Times Moved in the Last Year: Not on file    Homeless in the Last Year: No  Utilities: Not At Risk (05/19/2023)   AHC Utilities    Threatened with loss of utilities: No  Health Literacy: Not on file    Past Surgical History:  Procedure Laterality Date   ABDOMINOPLASTY  2019   CATARACT EXTRACTION Left    2021   COLONOSCOPY     COSMETIC SURGERY  12/10/202   Tummy Tuck   EYE SURGERY     UPPER GASTROINTESTINAL ENDOSCOPY      Family History  Problem Relation Age of Onset   Arthritis Maternal Grandmother    Diabetes Maternal Grandmother    Hypertension Maternal Grandmother    Throat cancer Maternal Grandfather    Birth defects Maternal Grandfather    Breast cancer Neg Hx    Colon cancer Neg Hx    Stomach cancer Neg Hx    Esophageal cancer Neg Hx    Rectal cancer Neg Hx     Allergies[1]  Medications Ordered Prior to Encounter[2]  BP 130/84   Pulse 94   Temp 98 F (36.7 C) (Oral)   Ht 5' 7 (1.702 m)   Wt 186 lb 8 oz (84.6 kg)   SpO2 94%   BMI 29.21 kg/m  Objective:   Physical Exam HENT:     Right Ear: Tympanic membrane and ear canal normal.     Left Ear: Tympanic membrane and ear canal normal.  Eyes:     Pupils: Pupils are equal, round, and reactive to light.  Cardiovascular:     Rate and Rhythm: Normal rate and regular rhythm.  Pulmonary:     Effort: Pulmonary effort is normal.     Breath sounds: Normal breath sounds.  Abdominal:     General: Bowel sounds are normal.     Palpations: Abdomen is soft.     Tenderness: There is no abdominal tenderness.  Musculoskeletal:        General: Normal range of motion.     Cervical back: Neck  supple.  Skin:    General: Skin is warm and dry.  Neurological:     Mental Status: She is alert and oriented to person, place, and time.     Cranial Nerves: No cranial nerve deficit.     Deep Tendon Reflexes:     Reflex Scores:  Patellar reflexes are 2+ on the right side and 2+ on the left side. Psychiatric:        Mood and Affect: Mood normal.     Physical Exam        Assessment & Plan:  Preventative health care Assessment & Plan: Declines influenza vaccine. Declines Shingrix vaccines.  Pap smear UTD. Follows with GYN Mammogram due, orders placed. Colonoscopy UTD, due 2033  Discussed the importance of a healthy diet and regular exercise in order for weight loss, and to reduce the risk of further co-morbidity.  Exam stable. Labs pending.  Follow up in 1 year for repeat physical.    Prediabetes Assessment & Plan: Repeat A1C pending.  Work on a healthy diet and regular exercise in order for weight loss, and to reduce the risk of further co-morbidity.   Orders: -     Hemoglobin A1c  GAD (generalized anxiety disorder) Assessment & Plan: Controlled.  Continue Lexapro  20 mg daily.   Screening mammogram for breast cancer -     3D Screening Mammogram, Left and Right; Future  Essential hypertension Assessment & Plan: Initially elevated, improved upon recheck She was not monitoring blood pressure readings at home and report if they are distantly at or greater than 130/90.  Continue nifedipine  XL 30 mg daily.  Orders: -     Lipid panel -     Comprehensive metabolic panel with GFR    Assessment and Plan Assessment & Plan         Comer MARLA Gaskins, NP       [1] No Known Allergies [2]  Current Outpatient Medications on File Prior to Visit  Medication Sig Dispense Refill   ciclopirox  (LOPROX ) 0.77 % cream Apply topically 2 (two) times daily. 30 g 0   escitalopram  (LEXAPRO ) 20 MG tablet Take 1 tablet (20 mg total) by mouth daily. For  anxiety 90 tablet 0   NIFEdipine  (PROCARDIA -XL/NIFEDICAL-XL) 30 MG 24 hr tablet Take 1 tablet (30 mg total) by mouth daily. 90 tablet 3   Probiotic Product (PROBIOTIC PO) Take 1 capsule by mouth daily. Align     Vitamin D -Vitamin K (VITAMIN K2-VITAMIN D3 PO) Take 1 capsule by mouth daily.     No current facility-administered medications on file prior to visit.   "

## 2024-03-07 NOTE — Assessment & Plan Note (Signed)
 Declines influenza vaccine. Declines Shingrix vaccines.  Pap smear UTD. Follows with GYN Mammogram due, orders placed. Colonoscopy UTD, due 2033  Discussed the importance of a healthy diet and regular exercise in order for weight loss, and to reduce the risk of further co-morbidity.  Exam stable. Labs pending.  Follow up in 1 year for repeat physical.

## 2024-03-07 NOTE — Assessment & Plan Note (Signed)
Controlled. ° °Continue Lexapro 20 mg daily. °

## 2024-03-07 NOTE — Patient Instructions (Signed)
 Stop by the lab prior to leaving today. I will notify you of your results once received.   [facility].   Start monitoring your blood pressure daily, around the same time of day, for the next 2-3 weeks.  Ensure that you have rested for 30 minutes prior to checking your blood pressure.   Record your readings and notify me if you see numbers consistently at or above 130 on top and/or 90 on bottom.  It was a pleasure to see you today!

## 2024-03-07 NOTE — Addendum Note (Signed)
 Addended by: Famous Eisenhardt K on: 03/07/2024 09:08 AM   Modules accepted: Orders

## 2024-03-07 NOTE — Assessment & Plan Note (Signed)
 Initially elevated, improved upon recheck She was not monitoring blood pressure readings at home and report if they are distantly at or greater than 130/90.  Continue nifedipine  XL 30 mg daily.

## 2024-03-07 NOTE — Assessment & Plan Note (Signed)
 Repeat A1C pending.  Work on a healthy diet and regular exercise in order for weight loss, and to reduce the risk of further co-morbidity.

## 2024-03-09 ENCOUNTER — Telehealth: Payer: Self-pay | Admitting: Cardiology

## 2024-03-09 NOTE — Telephone Encounter (Signed)
 Berta with [facility] calling to ask for notes from patients CPAP appt. Please advise.  Fax #[phone]

## 2024-03-09 NOTE — Telephone Encounter (Signed)
 Reached out to Terri at  [facility] to inform them the patient has not been seen in the office since she received her cpap. I then messaged the sleep schedulers to please call patient and make her a compliance appointment. Chloe M called the patient 2x and got hung up pn 2x. Chloe told me and I called her but did not get an answer but I left a message to call Chloe back and make an appointment to be seen for her insurance purposes. I let [facility] Thyra) know and she said she will make a note in her chart at [facility] as well.

## 2024-04-10 ENCOUNTER — Ambulatory Visit

## 2024-04-12 ENCOUNTER — Ambulatory Visit
